# Patient Record
Sex: Female | Born: 1995 | Hispanic: Yes | Marital: Married | Smoking: Former smoker
Health system: Southern US, Community
[De-identification: ages and names within clinical notes are randomized; demographics above are authoritative.]

## PROBLEM LIST (undated history)

## (undated) DIAGNOSIS — I10 Essential (primary) hypertension: Secondary | ICD-10-CM

## (undated) DIAGNOSIS — Z8619 Personal history of other infectious and parasitic diseases: Secondary | ICD-10-CM

## (undated) DIAGNOSIS — E119 Type 2 diabetes mellitus without complications: Secondary | ICD-10-CM

## (undated) DIAGNOSIS — O24419 Gestational diabetes mellitus in pregnancy, unspecified control: Secondary | ICD-10-CM

## (undated) HISTORY — PX: NO PAST SURGERIES: SHX2092

## (undated) HISTORY — DX: Type 2 diabetes mellitus without complications: E11.9

## (undated) HISTORY — DX: Gestational diabetes mellitus in pregnancy, unspecified control: O24.419

---
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
## 2017-03-15 ENCOUNTER — Encounter (HOSPITAL_COMMUNITY): Payer: Self-pay | Admitting: Emergency Medicine

## 2017-03-15 ENCOUNTER — Ambulatory Visit (HOSPITAL_COMMUNITY)
Admission: EM | Admit: 2017-03-15 | Discharge: 2017-03-15 | Disposition: A | Discharge status: Home / Self Care | Payer: Self-pay | Attending: Family Medicine | Admitting: Family Medicine

## 2017-03-15 DIAGNOSIS — B349 Viral infection, unspecified: Secondary | ICD-10-CM

## 2017-03-15 DIAGNOSIS — M545 Low back pain: Secondary | ICD-10-CM

## 2017-03-15 DIAGNOSIS — G8929 Other chronic pain: Secondary | ICD-10-CM

## 2017-03-15 MED ORDER — BENZONATATE 100 MG PO CAPS: 100.0000 mg | ORAL_CAPSULE | Freq: Three times a day (TID) | ORAL | 0 refills | Status: DC

## 2017-03-15 MED ORDER — ACETAMINOPHEN 325 MG PO TABS
650.0000 mg | ORAL_TABLET | Freq: Once | ORAL | Status: AC
Start: 2017-03-15 — End: 2017-03-15
  Administered 2017-03-15: 650 mg via ORAL

## 2017-03-15 MED ORDER — ACETAMINOPHEN 325 MG PO TABS
ORAL_TABLET | ORAL | Status: AC
Start: 2017-03-15 — End: ?
  Filled 2017-03-15: qty 2

## 2017-03-15 MED ORDER — MELOXICAM 7.5 MG PO TABS: 7.5000 mg | ORAL_TABLET | Freq: Every day | ORAL | 0 refills | Status: DC

## 2017-03-15 MED ORDER — FLUTICASONE PROPIONATE 50 MCG/ACT NA SUSP: 2.0000 | Freq: Every day | NASAL | 0 refills | Status: DC

## 2017-03-15 MED ORDER — IPRATROPIUM BROMIDE 0.06 % NA SOLN: 2.0000 | Freq: Four times a day (QID) | NASAL | 0 refills | Status: DC

## 2017-03-15 NOTE — ED Provider Notes (Signed)
[facility]    CSN: [id number] Arrival date & time: 03/15/17  1534     History   Chief Complaint Chief Complaint  Patient presents with  . URI    HPI Julie Ellison is a 22 y.o. female.   22 year old female comes in for 2 day history of URI symptoms. Sneezing, nonproductive cough, rhinorrhea, nasal congestion, body aches, chills. Tactile fever this morning. Has not tried anything for the symptoms. Denies weakness, dizziness, chest pain, shortness of breath, palpitations. Positive sick contact. THC, last used 2 days ago, used to be every day, drop down to every few days, 1 year. Current every day smoker, 1 cigs/day, 1 year.   Patient states she started using THC due to chronic back pain. Denies injury in the past. States pain causes her to be unable to sleep, so she uses THC for it. States pain is bilateral without radiation. Has not tried anything else for it. Denies numbness/tingling, loss of bladder or bowel control       History reviewed. No pertinent past medical history.  There are no active problems to display for this patient.   History reviewed. No pertinent surgical history.  OB History    No data available       Home Medications    Prior to Admission medications   Medication Sig Start Date End Date Taking? Authorizing Provider  benzonatate (TESSALON) 100 MG capsule Take 1 capsule (100 mg total) by mouth every 8 (eight) hours. 03/15/17   Cathie HoopsYu, Charlisa Cham V, PA-C  fluticasone (FLONASE) 50 MCG/ACT nasal spray Place 2 sprays into both nostrils daily. 03/15/17   Cathie HoopsYu, Dru Laurel V, PA-C  ipratropium (ATROVENT) 0.06 % nasal spray Place 2 sprays into both nostrils 4 (four) times daily. 03/15/17   Cathie HoopsYu, Ardice Boyan V, PA-C  meloxicam (MOBIC) 7.5 MG tablet Take 1 tablet (7.5 mg total) by mouth daily. 03/15/17   Belinda FisherYu, Dao Mearns V, PA-C    Family History No family history on file.  Social History Social History   Tobacco Use  . Smoking status: Not on file  Substance Use Topics    . Alcohol use: Not on file  . Drug use: Not on file     Allergies   Patient has no known allergies.   Review of Systems Review of Systems  Reason unable to perform ROS: See HPI as above.     Physical Exam Triage Vital Signs ED Triage Vitals [03/15/17 1604]  Enc Vitals Group     BP (!) 144/84     Pulse Rate (!) 138     Resp 18     Temp (!) 100.4 F (38 C)     Temp Source Temporal     SpO2 100 %     Weight 150 lb (68 kg)     Height 5\' 3"  (1.6 m)     Head Circumference      Peak Flow      Pain Score 7     Pain Loc      Pain Edu?      Excl. in GC?    No data found.  Updated Vital Signs BP (!) 144/84   Pulse (!) 138   Temp 99.5 F (37.5 C)   Resp 18   Ht 5\' 3"  (1.6 m)   Wt 150 lb (68 kg)   LMP 03/10/2017   SpO2 97%   BMI 26.57 kg/m   Physical Exam  Constitutional: She is oriented to person, place, and time. She  appears well-developed and well-nourished. No distress.  HENT:  Head: Normocephalic and atraumatic.  Right Ear: Tympanic membrane, external ear and ear canal normal. Tympanic membrane is not erythematous and not bulging.  Left Ear: Tympanic membrane, external ear and ear canal normal. Tympanic membrane is not erythematous and not bulging.  Nose: Mucosal edema and rhinorrhea present. Right sinus exhibits no maxillary sinus tenderness and no frontal sinus tenderness. Left sinus exhibits no maxillary sinus tenderness and no frontal sinus tenderness.  Mouth/Throat: Uvula is midline, oropharynx is clear and moist and mucous membranes are normal.  Eyes: Conjunctivae are normal. Pupils are equal, round, and reactive to light.  Neck: Normal range of motion. Neck supple.  Cardiovascular: Regular rhythm and normal heart sounds. Tachycardia present. Exam reveals no gallop and no friction rub.  No murmur heard. Pulmonary/Chest: Effort normal and breath sounds normal. She has no decreased breath sounds. She has no wheezes. She has no rhonchi. She has no rales.   Musculoskeletal:  No tenderness on palpation of the spinous processes.  Tenderness on palpation of bilateral lumbar region.  Full range of motion.  Lymphadenopathy:    She has no cervical adenopathy.  Neurological: She is alert and oriented to person, place, and time.  Skin: Skin is warm and dry.  Psychiatric: She has a normal mood and affect. Her behavior is normal. Judgment normal.   UC Treatments / Results  Labs (all labs ordered are listed, but only abnormal results are displayed) Labs Reviewed - No data to display  EKG  EKG Interpretation None       Radiology No results found.  Procedures Procedures (including critical care time)  Medications Ordered in UC Medications  acetaminophen (TYLENOL) tablet 650 mg (650 mg Oral Given 03/15/17 1609)     Initial Impression / Assessment and Plan / UC Course  I have reviewed the triage vital signs and the nursing notes.  Pertinent labs & imaging results that were available during my care of the patient were reviewed by me and considered in my medical decision making (see chart for details).    Discussed with patient history and exam most consistent with viral URI. Patient with tachycardia on exam without chest pain, shortness of breath, palpitations, weakness, dizziness. Will have patient push fluids and monitor for now. Symptomatic treatment as needed. Return precautions given.   Will have patient try Mobic for chronic bilateral low back pain without sciatica, which is atraumatic. Return precautions given.   Patient expresses understanding and agrees to plan.   Final Clinical Impressions(s) / UC Diagnoses   Final diagnoses:  Viral syndrome  Chronic bilateral low back pain without sciatica    ED Discharge Orders        Ordered    benzonatate (TESSALON) 100 MG capsule  Every 8 hours     03/15/17 1637    fluticasone (FLONASE) 50 MCG/ACT nasal spray  Daily     03/15/17 1637    ipratropium (ATROVENT) 0.06 % nasal spray   4 times daily     03/15/17 1637    meloxicam (MOBIC) 7.5 MG tablet  Daily     03/15/17 1637        Belinda Fisher, PA-C 03/15/17 1646

## 2017-03-15 NOTE — ED Triage Notes (Signed)
PT reports she was sick with URI symptoms 2 weeks ago. PT reports symptoms returned 2 days ago and are worse now. PT reports sneezing, coughing, body aches, and chills.

## 2017-03-15 NOTE — Discharge Instructions (Signed)
Tessalon for cough. Start flonase, atrovent nasal spray for nasal congestion/drainage. You can use over the counter nasal saline rinse such as neti pot for nasal congestion. Keep hydrated, your urine should be clear to pale yellow in color. Tylenol/motrin for fever and pain. Monitor for any worsening of symptoms, chest pain, shortness of breath, wheezing, swelling of the throat, follow up for reevaluation.   Start Mobic as directed. Ice/heat compresses as needed. This can take up to 3-4 weeks to completely resolve, but you should be feeling better each week. Follow up here or with PCP if symptoms worsen, changes for reevaluation. If experience numbness/tingling of the inner thighs, loss of bladder or bowel control, go to the emergency department for evaluation.   If taking over the counter medicine, you can take:  Ibuprofen 800mg  three times a day Naproxen 440mg  twice a day

## 2017-11-03 ENCOUNTER — Inpatient Hospital Stay (HOSPITAL_COMMUNITY): Payer: Self-pay

## 2017-11-03 ENCOUNTER — Encounter (HOSPITAL_COMMUNITY)
Admission: AD | Disposition: A | Discharge status: Home / Self Care | Payer: Self-pay | Source: Ambulatory Visit | Attending: Family Medicine

## 2017-11-03 ENCOUNTER — Inpatient Hospital Stay (HOSPITAL_COMMUNITY): Payer: Self-pay | Admitting: Anesthesiology

## 2017-11-03 ENCOUNTER — Inpatient Hospital Stay (HOSPITAL_COMMUNITY)
Admission: AD | Admit: 2017-11-03 | Discharge: 2017-11-03 | Disposition: A | Discharge status: Home / Self Care | Payer: Self-pay | Source: Ambulatory Visit | Attending: Family Medicine | Admitting: Family Medicine

## 2017-11-03 ENCOUNTER — Encounter (HOSPITAL_COMMUNITY): Payer: Self-pay | Admitting: *Deleted

## 2017-11-03 DIAGNOSIS — Z7951 Long term (current) use of inhaled steroids: Secondary | ICD-10-CM | POA: Insufficient documentation

## 2017-11-03 DIAGNOSIS — O209 Hemorrhage in early pregnancy, unspecified: Secondary | ICD-10-CM

## 2017-11-03 DIAGNOSIS — Z3A01 Less than 8 weeks gestation of pregnancy: Secondary | ICD-10-CM | POA: Insufficient documentation

## 2017-11-03 DIAGNOSIS — Z87891 Personal history of nicotine dependence: Secondary | ICD-10-CM | POA: Insufficient documentation

## 2017-11-03 DIAGNOSIS — Z79899 Other long term (current) drug therapy: Secondary | ICD-10-CM | POA: Insufficient documentation

## 2017-11-03 DIAGNOSIS — Z791 Long term (current) use of non-steroidal anti-inflammatories (NSAID): Secondary | ICD-10-CM | POA: Insufficient documentation

## 2017-11-03 DIAGNOSIS — O00101 Right tubal pregnancy without intrauterine pregnancy: Secondary | ICD-10-CM | POA: Diagnosis present

## 2017-11-03 HISTORY — DX: Personal history of other infectious and parasitic diseases: Z86.19

## 2017-11-03 HISTORY — PX: DIAGNOSTIC LAPAROSCOPY WITH REMOVAL OF ECTOPIC PREGNANCY: SHX6449

## 2017-11-03 LAB — URINALYSIS, ROUTINE W REFLEX MICROSCOPIC
BILIRUBIN URINE: NEGATIVE
Bacteria, UA: NONE SEEN
Glucose, UA: NEGATIVE mg/dL
HGB URINE DIPSTICK: NEGATIVE
Ketones, ur: NEGATIVE mg/dL
NITRITE: NEGATIVE
PH: 8 (ref 5.0–8.0)
Protein, ur: NEGATIVE mg/dL
SPECIFIC GRAVITY, URINE: 1.019 (ref 1.005–1.030)

## 2017-11-03 LAB — CBC
HCT: 42.6 % (ref 36.0–46.0)
HEMOGLOBIN: 14.5 g/dL (ref 12.0–15.0)
MCH: 30.1 pg (ref 26.0–34.0)
MCHC: 34 g/dL (ref 30.0–36.0)
MCV: 88.6 fL (ref 78.0–100.0)
Platelets: 263 10*3/uL (ref 150–400)
RBC: 4.81 MIL/uL (ref 3.87–5.11)
RDW: 11.8 % (ref 11.5–15.5)
WBC: 11.2 10*3/uL — ABNORMAL HIGH (ref 4.0–10.5)

## 2017-11-03 LAB — COMPREHENSIVE METABOLIC PANEL
ALBUMIN: 4 g/dL (ref 3.5–5.0)
ALT: 28 U/L (ref 0–44)
ANION GAP: 5 (ref 5–15)
AST: 27 U/L (ref 15–41)
Alkaline Phosphatase: 63 U/L (ref 38–126)
BILIRUBIN TOTAL: 0.6 mg/dL (ref 0.3–1.2)
BUN: 10 mg/dL (ref 6–20)
CHLORIDE: 106 mmol/L (ref 98–111)
CO2: 26 mmol/L (ref 22–32)
Calcium: 9.2 mg/dL (ref 8.9–10.3)
Creatinine, Ser: 0.54 mg/dL (ref 0.44–1.00)
GFR calc Af Amer: >60 mL/min (ref >60)
Glucose, Bld: 95 mg/dL (ref 70–99)
POTASSIUM: 3.7 mmol/L (ref 3.5–5.1)
Sodium: 137 mmol/L (ref 135–145)
TOTAL PROTEIN: 7.4 g/dL (ref 6.5–8.1)

## 2017-11-03 LAB — WET PREP, GENITAL
SPERM: NONE SEEN
Trich, Wet Prep: NONE SEEN
YEAST WET PREP: NONE SEEN

## 2017-11-03 LAB — POCT PREGNANCY, URINE: Preg Test, Ur: POSITIVE — AB

## 2017-11-03 LAB — TYPE AND SCREEN
ABO/RH(D): O POS
Antibody Screen: NEGATIVE

## 2017-11-03 LAB — ABO/RH: ABO/RH(D): O POS

## 2017-11-03 LAB — HCG, QUANTITATIVE, PREGNANCY: HCG, BETA CHAIN, QUANT, S: 16930 m[IU]/mL — AB (ref <5)

## 2017-11-03 SURGERY — LAPAROSCOPY, WITH ECTOPIC PREGNANCY SURGICAL TREATMENT
Anesthesia: General | Site: Abdomen

## 2017-11-03 MED ORDER — HYDROMORPHONE HCL 1 MG/ML IJ SOLN
INTRAMUSCULAR | Status: DC | PRN
  Administered 2017-11-03: 1 mg via INTRAVENOUS

## 2017-11-03 MED ORDER — IBUPROFEN 600 MG PO TABS: 600.0000 mg | ORAL_TABLET | Freq: Four times a day (QID) | ORAL | 1 refills | Status: DC | PRN

## 2017-11-03 MED ORDER — OXYCODONE-ACETAMINOPHEN 5-325 MG PO TABS: 1.0000 | ORAL_TABLET | Freq: Four times a day (QID) | ORAL | 0 refills | Status: DC | PRN

## 2017-11-03 MED ORDER — ONDANSETRON HCL 4 MG/2ML IJ SOLN
INTRAMUSCULAR | Status: DC | PRN
  Administered 2017-11-03: 4 mg via INTRAVENOUS

## 2017-11-03 MED ORDER — PROPOFOL 10 MG/ML IV BOLUS
INTRAVENOUS | Status: AC
  Filled 2017-11-03: qty 20

## 2017-11-03 MED ORDER — MIDAZOLAM HCL 2 MG/2ML IJ SOLN
INTRAMUSCULAR | Status: DC | PRN
  Administered 2017-11-03: 2 mg via INTRAVENOUS

## 2017-11-03 MED ORDER — GLYCOPYRROLATE 0.2 MG/ML IJ SOLN
INTRAMUSCULAR | Status: DC | PRN
  Administered 2017-11-03: 0.1 mg via INTRAVENOUS

## 2017-11-03 MED ORDER — FAMOTIDINE IN NACL 20-0.9 MG/50ML-% IV SOLN
20.0000 mg | Freq: Once | INTRAVENOUS | Status: AC
  Administered 2017-11-03: 20 mg via INTRAVENOUS
  Filled 2017-11-03: qty 50

## 2017-11-03 MED ORDER — KETOROLAC TROMETHAMINE 30 MG/ML IJ SOLN
INTRAMUSCULAR | Status: DC | PRN
  Administered 2017-11-03: 30 mg via INTRAVENOUS

## 2017-11-03 MED ORDER — GLYCOPYRROLATE 0.2 MG/ML IJ SOLN
INTRAMUSCULAR | Status: AC
Start: 2017-11-03 — End: 2017-11-03
  Filled 2017-11-03: qty 1

## 2017-11-03 MED ORDER — SOD CITRATE-CITRIC ACID 500-334 MG/5ML PO SOLN
30.0000 mL | Freq: Once | ORAL | Status: AC
  Administered 2017-11-03: 30 mL via ORAL
  Filled 2017-11-03: qty 15

## 2017-11-03 MED ORDER — LIDOCAINE HCL (CARDIAC) PF 100 MG/5ML IV SOSY
PREFILLED_SYRINGE | INTRAVENOUS | Status: AC
  Filled 2017-11-03: qty 5

## 2017-11-03 MED ORDER — BUPIVACAINE HCL (PF) 0.5 % IJ SOLN
INTRAMUSCULAR | Status: AC
  Filled 2017-11-03: qty 30

## 2017-11-03 MED ORDER — ONDANSETRON HCL 4 MG/2ML IJ SOLN
INTRAMUSCULAR | Status: AC
  Filled 2017-11-03: qty 2

## 2017-11-03 MED ORDER — SODIUM CHLORIDE 0.9 % IR SOLN
Status: DC | PRN
  Administered 2017-11-03: 3000 mL

## 2017-11-03 MED ORDER — FENTANYL CITRATE (PF) 100 MCG/2ML IJ SOLN
25.0000 ug | INTRAMUSCULAR | Status: DC | PRN
  Administered 2017-11-03 (×2): 50 ug via INTRAVENOUS

## 2017-11-03 MED ORDER — FENTANYL CITRATE (PF) 100 MCG/2ML IJ SOLN
INTRAMUSCULAR | Status: AC
  Filled 2017-11-03: qty 2

## 2017-11-03 MED ORDER — FENTANYL CITRATE (PF) 100 MCG/2ML IJ SOLN
INTRAMUSCULAR | Status: DC | PRN
  Administered 2017-11-03 (×2): 50 ug via INTRAVENOUS
  Administered 2017-11-03: 100 ug via INTRAVENOUS
  Administered 2017-11-03: 50 ug via INTRAVENOUS

## 2017-11-03 MED ORDER — LACTATED RINGERS IV SOLN
INTRAVENOUS | Status: DC
  Administered 2017-11-03 (×2): via INTRAVENOUS

## 2017-11-03 MED ORDER — SUGAMMADEX SODIUM 200 MG/2ML IV SOLN
INTRAVENOUS | Status: DC | PRN
  Administered 2017-11-03: 150 mg via INTRAVENOUS

## 2017-11-03 MED ORDER — VASOPRESSIN 20 UNIT/ML IV SOLN
INTRAVENOUS | Status: DC | PRN
  Administered 2017-11-03: 10 mL via INTRAMUSCULAR

## 2017-11-03 MED ORDER — PROPOFOL 10 MG/ML IV BOLUS
INTRAVENOUS | Status: DC | PRN
  Administered 2017-11-03: 150 mg via INTRAVENOUS

## 2017-11-03 MED ORDER — MIDAZOLAM HCL 2 MG/2ML IJ SOLN
INTRAMUSCULAR | Status: AC
  Filled 2017-11-03: qty 2

## 2017-11-03 MED ORDER — ROCURONIUM BROMIDE 100 MG/10ML IV SOLN
INTRAVENOUS | Status: AC
  Filled 2017-11-03: qty 1

## 2017-11-03 MED ORDER — BUPIVACAINE HCL (PF) 0.5 % IJ SOLN
INTRAMUSCULAR | Status: DC | PRN
  Administered 2017-11-03: 30 mL

## 2017-11-03 MED ORDER — SUGAMMADEX SODIUM 200 MG/2ML IV SOLN
INTRAVENOUS | Status: AC
  Filled 2017-11-03: qty 2

## 2017-11-03 MED ORDER — DEXAMETHASONE SODIUM PHOSPHATE 10 MG/ML IJ SOLN
INTRAMUSCULAR | Status: DC | PRN
  Administered 2017-11-03: 5 mg via INTRAVENOUS

## 2017-11-03 MED ORDER — SODIUM CHLORIDE 0.9 % IJ SOLN
INTRAMUSCULAR | Status: AC
  Filled 2017-11-03: qty 10

## 2017-11-03 MED ORDER — BUPIVACAINE HCL (PF) 0.25 % IJ SOLN
INTRAMUSCULAR | Status: AC
  Filled 2017-11-03: qty 30

## 2017-11-03 MED ORDER — SUCCINYLCHOLINE CHLORIDE 200 MG/10ML IV SOSY
PREFILLED_SYRINGE | INTRAVENOUS | Status: AC
  Filled 2017-11-03: qty 10

## 2017-11-03 MED ORDER — DEXAMETHASONE SODIUM PHOSPHATE 10 MG/ML IJ SOLN
INTRAMUSCULAR | Status: AC
  Filled 2017-11-03: qty 1

## 2017-11-03 MED ORDER — ROCURONIUM BROMIDE 100 MG/10ML IV SOLN
INTRAVENOUS | Status: DC | PRN
  Administered 2017-11-03: 10 mg via INTRAVENOUS
  Administered 2017-11-03: 50 mg via INTRAVENOUS

## 2017-11-03 MED ORDER — ACETAMINOPHEN 10 MG/ML IV SOLN: 1000.0000 mg | Freq: Once | INTRAVENOUS | Status: DC | PRN

## 2017-11-03 MED ORDER — FENTANYL CITRATE (PF) 250 MCG/5ML IJ SOLN
INTRAMUSCULAR | Status: AC
  Filled 2017-11-03: qty 5

## 2017-11-03 MED ORDER — LIDOCAINE HCL (CARDIAC) PF 100 MG/5ML IV SOSY
PREFILLED_SYRINGE | INTRAVENOUS | Status: DC | PRN
  Administered 2017-11-03: 100 mg via INTRAVENOUS

## 2017-11-03 MED ORDER — HYDROMORPHONE HCL 1 MG/ML IJ SOLN
INTRAMUSCULAR | Status: AC
  Filled 2017-11-03: qty 1

## 2017-11-03 SURGICAL SUPPLY — 27 items
BLADE SURG 15 STRL LF C SS BP (BLADE) ×1 IMPLANT
BLADE SURG 15 STRL SS (BLADE) ×2
DRSG OPSITE POSTOP 3X4 (GAUZE/BANDAGES/DRESSINGS) IMPLANT
DURAPREP 26ML APPLICATOR (WOUND CARE) ×3 IMPLANT
GLOVE BIOGEL PI IND STRL 7.0 (GLOVE) ×4 IMPLANT
GLOVE BIOGEL PI INDICATOR 7.0 (GLOVE) ×8
GLOVE ECLIPSE 7.0 STRL STRAW (GLOVE) ×3 IMPLANT
GOWN STRL REUS W/TWL LRG LVL3 (GOWN DISPOSABLE) ×9 IMPLANT
HEMOSTAT ARISTA ABSORB 3G PWDR (MISCELLANEOUS) ×3 IMPLANT
PACK LAPAROSCOPY BASIN (CUSTOM PROCEDURE TRAY) ×3 IMPLANT
PACK TRENDGUARD 450 HYBRID PRO (MISCELLANEOUS) ×1 IMPLANT
POUCH SPECIMEN RETRIEVAL 10MM (ENDOMECHANICALS) ×6 IMPLANT
PROTECTOR NERVE ULNAR (MISCELLANEOUS) ×6 IMPLANT
SET IRRIG TUBING LAPAROSCOPIC (IRRIGATION / IRRIGATOR) ×3 IMPLANT
SHEARS HARMONIC ACE PLUS 36CM (ENDOMECHANICALS) ×3 IMPLANT
SLEEVE XCEL OPT CAN 5 100 (ENDOMECHANICALS) ×3 IMPLANT
SUT VIC AB 3-0 X1 27 (SUTURE) ×3 IMPLANT
SUT VICRYL 0 UR6 27IN ABS (SUTURE) ×6 IMPLANT
TOWEL OR 17X24 6PK STRL BLUE (TOWEL DISPOSABLE) ×6 IMPLANT
TRAY FOLEY W/BAG SLVR 14FR (SET/KITS/TRAYS/PACK) ×6 IMPLANT
TRENDGUARD 450 HYBRID PRO PACK (MISCELLANEOUS) ×3
TROCAR BALLN 12MMX100 BLUNT (TROCAR) ×3 IMPLANT
TROCAR OPTI TIP 5M 100M (ENDOMECHANICALS) ×3 IMPLANT
TROCAR XCEL NON-BLD 11X100MML (ENDOMECHANICALS) IMPLANT
TROCAR XCEL NON-BLD 5MMX100MML (ENDOMECHANICALS) ×3 IMPLANT
TUBING INSUF HEATED (TUBING) ×3 IMPLANT
WARMER LAPAROSCOPE (MISCELLANEOUS) ×3 IMPLANT

## 2017-11-03 NOTE — Brief Op Note (Signed)
11/03/2017  8:49 PM  PATIENT:  Julie Ellison  22 y.o. female  PRE-OPERATIVE DIAGNOSIS:  Ectopic pregnancy  POST-OPERATIVE DIAGNOSIS:  Ectopic pregnancy  PROCEDURE:  Procedure(s): DIAGNOSTIC LAPAROSCOPY, Right Salpingectomy with removal ECTOPIC PREGNANCY (N/A)  SURGEON:  Surgeon(s) and Role:    * Willodean Rosenthal, MD - Primary  ANESTHESIA:   general  EBL:  10 mL   BLOOD ADMINISTERED:none  DRAINS: none   LOCAL MEDICATIONS USED:  MARCAINE; Dilute vasopressin solution     SPECIMEN:  Source of Specimen:  right fallopian tube with ectopic  DISPOSITION OF SPECIMEN:  PATHOLOGY  COUNTS:  YES  TOURNIQUET:  * No tourniquets in log *  DICTATION: .Note written in EPIC  PLAN OF CARE: Discharge to home after PACU  PATIENT DISPOSITION:  PACU - hemodynamically stable.   Delay start of Pharmacological VTE agent (>24hrs) due to surgical blood loss or risk of bleeding: n/a  Complications: none immediate.   Caylon Saine L. Harraway-Smith, M.D., [facility] Core

## 2017-11-03 NOTE — MAU Provider Note (Signed)
Chief Complaint: Vaginal Bleeding and Possible Pregnancy   First Provider Initiated Contact with Patient 11/03/17 1431     SUBJECTIVE HPI: Julie Ellison is a 22 y.o. G1P0 at [redacted]w[redacted]d who presents to Maternity Admissions reporting vaginal bleeding. Symptoms started today. Had bright red spotting on toilet paper. Not bleeding into pad or passing clots. Denies abdominal pain. Had low back pain earlier that has since resolved. Denies dysuria or vaginal discharge. Had intercourse last night. Has not started prenatal care.    Past Medical History:  Diagnosis Date  . Hx of gonorrhea 2017   OB History  Gravida Para Term Preterm AB Living  1            SAB TAB Ectopic Multiple Live Births               # Outcome Date GA Lbr Len/2nd Weight Sex Delivery Anes PTL Lv  1 Current            Past Surgical History:  Procedure Laterality Date  . NO PAST SURGERIES     Social History   Socioeconomic History  . Marital status: Married    Spouse name: Not on file  . Number of children: Not on file  . Years of education: Not on file  . Highest education level: Not on file  Occupational History  . Not on file  Social Needs  . Financial resource strain: Not on file  . Food insecurity:    Worry: Not on file    Inability: Not on file  . Transportation needs:    Medical: Not on file    Non-medical: Not on file  Tobacco Use  . Smoking status: Former Games developer  . Smokeless tobacco: Never Used  Substance and Sexual Activity  . Alcohol use: Not Currently  . Drug use: Never  . Sexual activity: Yes    Birth control/protection: None    Comment: IUD removed 09/05/17  Lifestyle  . Physical activity:    Days per week: Not on file    Minutes per session: Not on file  . Stress: Not on file  Relationships  . Social connections:    Talks on phone: Not on file    Gets together: Not on file    Attends religious service: Not on file    Active member of club or organization: Not on file     Attends meetings of clubs or organizations: Not on file    Relationship status: Not on file  . Intimate partner violence:    Fear of current or ex partner: Not on file    Emotionally abused: Not on file    Physically abused: Not on file    Forced sexual activity: Not on file  Other Topics Concern  . Not on file  Social History Narrative  . Not on file   History reviewed. No pertinent family history. No current facility-administered medications on file prior to encounter.    Current Outpatient Medications on File Prior to Encounter  Medication Sig Dispense Refill  . benzonatate (TESSALON) 100 MG capsule Take 1 capsule (100 mg total) by mouth every 8 (eight) hours. 21 capsule 0  . fluticasone (FLONASE) 50 MCG/ACT nasal spray Place 2 sprays into both nostrils daily. 1 g 0  . ipratropium (ATROVENT) 0.06 % nasal spray Place 2 sprays into both nostrils 4 (four) times daily. 15 mL 0  . meloxicam (MOBIC) 7.5 MG tablet Take 1 tablet (7.5 mg total) by mouth daily. 15 tablet 0  No Known Allergies  I have reviewed patient's Past Medical Hx, Surgical Hx, Family Hx, Social Hx, medications and allergies.   Review of Systems  Constitutional: Negative.   Gastrointestinal: Negative.   Genitourinary: Positive for vaginal bleeding. Negative for dysuria and vaginal discharge.    OBJECTIVE Patient Vitals for the past 24 hrs:  BP Temp Temp src Pulse Resp SpO2 Height Weight  11/03/17 1358 109/69 98.1 F (36.7 C) Oral 82 15 100 % 5\' 3"  (1.6 m) 74.4 kg   Constitutional: Well-developed, well-nourished female in no acute distress.  Cardiovascular: normal rate & rhythm, no murmur Respiratory: normal rate and effort. Lung sounds clear throughout GI: Abd soft, non-tender, Pos BS x 4. No guarding or rebound tenderness MS: Extremities nontender, no edema, normal ROM Neurologic: Alert and oriented x 4.  GU:     SPECULUM EXAM: NEFG, physiologic discharge, small smear of pink mucoid blood at os, no active  bleeding, cervix clean  BIMANUAL: No CMT. cervix closed; uterus normal size, no adnexal tenderness or masses.    LAB RESULTS Results for orders placed or performed during the hospital encounter of 11/03/17 (from the past 24 hour(s))  Urinalysis, Routine w reflex microscopic     Status: Abnormal   Collection Time: 11/03/17  2:06 PM  Result Value Ref Range   Color, Urine YELLOW YELLOW   APPearance HAZY (A) CLEAR   Specific Gravity, Urine 1.019 1.005 - 1.030   pH 8.0 5.0 - 8.0   Glucose, UA NEGATIVE NEGATIVE mg/dL   Hgb urine dipstick NEGATIVE NEGATIVE   Bilirubin Urine NEGATIVE NEGATIVE   Ketones, ur NEGATIVE NEGATIVE mg/dL   Protein, ur NEGATIVE NEGATIVE mg/dL   Nitrite NEGATIVE NEGATIVE   Leukocytes, UA TRACE (A) NEGATIVE   RBC / HPF 0-5 0 - 5 RBC/hpf   WBC, UA 0-5 0 - 5 WBC/hpf   Bacteria, UA NONE SEEN NONE SEEN   Squamous Epithelial / LPF 0-5 0 - 5   Mucus PRESENT   Pregnancy, urine POC     Status: Abnormal   Collection Time: 11/03/17  2:11 PM  Result Value Ref Range   Preg Test, Ur POSITIVE (A) NEGATIVE  CBC     Status: Abnormal   Collection Time: 11/03/17  3:03 PM  Result Value Ref Range   WBC 11.2 (H) 4.0 - 10.5 K/uL   RBC 4.81 3.87 - 5.11 MIL/uL   Hemoglobin 14.5 12.0 - 15.0 g/dL   HCT 54.0 98.1 - 19.1 %   MCV 88.6 78.0 - 100.0 fL   MCH 30.1 26.0 - 34.0 pg   MCHC 34.0 30.0 - 36.0 g/dL   RDW 47.8 29.5 - 62.1 %   Platelets 263 150 - 400 K/uL  ABO/Rh     Status: None   Collection Time: 11/03/17  3:03 PM  Result Value Ref Range   ABO/RH(D)      O POS Performed at [facility], [address], [place], [place] [address]   hCG, quantitative, pregnancy     Status: Abnormal   Collection Time: 11/03/17  3:03 PM  Result Value Ref Range   hCG, Beta Chain, Quant, S 16,930 (H) <5 mIU/mL  Comprehensive metabolic panel     Status: None   Collection Time: 11/03/17  3:03 PM  Result Value Ref Range   Sodium 137 135 - 145 mmol/L   Potassium 3.7 3.5 - 5.1 mmol/L    Chloride 106 98 - 111 mmol/L   CO2 26 22 - 32 mmol/L  Glucose, Bld 95 70 - 99 mg/dL   BUN 10 6 - 20 mg/dL   Creatinine, Ser 1.61 0.44 - 1.00 mg/dL   Calcium 9.2 8.9 - 09.6 mg/dL   Total Protein 7.4 6.5 - 8.1 g/dL   Albumin 4.0 3.5 - 5.0 g/dL   AST 27 15 - 41 U/L   ALT 28 0 - 44 U/L   Alkaline Phosphatase 63 38 - 126 U/L   Total Bilirubin 0.6 0.3 - 1.2 mg/dL   GFR calc non Af Amer >60 >60 mL/min   GFR calc Af Amer >60 >60 mL/min   Anion gap 5 5 - 15  Wet prep, genital     Status: Abnormal   Collection Time: 11/03/17  3:28 PM  Result Value Ref Range   Yeast Wet Prep HPF POC NONE SEEN NONE SEEN   Trich, Wet Prep NONE SEEN NONE SEEN   Clue Cells Wet Prep HPF POC PRESENT (A) NONE SEEN   WBC, Wet Prep HPF POC MANY (A) NONE SEEN   Sperm NONE SEEN     IMAGING US Ob Less Than 14 Weeks With Ob Transvaginal  Result Date: 11/03/2017 CLINICAL DATA:  Vaginal spotting EXAM: OBSTETRIC <14 WK [place] AND TRANSVAGINAL OB US TECHNIQUE: Both transabdominal and transvaginal ultrasound examinations were performed for complete evaluation of the gestation as well as the maternal uterus, adnexal regions, and pelvic cul-de-sac. Transvaginal technique was performed to assess early pregnancy. COMPARISON:  None. FINDINGS: Intrauterine gestational sac: Not visualized Yolk sac:  Visualized in ectopic location Embryo:  Visualized in ectopic location Cardiac Activity: Not visualized CRL:  8 mm   6 w   4 d Subchorionic hemorrhage:  None visualized. Maternal uterus/adnexae: There is a fetal pole and yolk sac within the gestational sac located in the right fallopian tube adjacent to but separate from the uterus. No fetal heart activity is evident. There is no intrauterine mass or gestation. The left ovary appears normal. There is a small amount of free pelvic fluid. IMPRESSION: Ectopic gestation in the right fallopian tube adjacent to the uterus. No intrauterine gestation seen. No fetal heart activity identified within the  ectopic gestation. Small amount of free fluid noted. Critical Value/emergent results were called by telephone at the time of interpretation on 11/03/2017 at 4:07 pm to Judeth Horn, NP, who verbally acknowledged these results. Electronically Signed   By: Bretta Bang III M.D.   On: 11/03/2017 16:07    MAU COURSE Orders Placed This Encounter  Procedures  . Wet prep, genital  . US OB LESS THAN 14 WEEKS WITH OB TRANSVAGINAL  . Urinalysis, Routine w reflex microscopic  . CBC  . hCG, quantitative, pregnancy  . HIV Antibody (routine testing w rflx)  . Comprehensive metabolic panel  . Pregnancy, urine POC  . ABO/Rh   No orders of the defined types were placed in this encounter.   MDM +UPT UA, wet prep, GC/chlamydia, CBC, ABO/Rh, quant hCG, and [place] today to rule out ectopic pregnancy Ultrasound shows ectopic pregnancy in right adnexa. Small free fluid. Gestational sac with YS & fetal pole, no cardiac activity. HCG 16,930 Reviewed with Dr. Shawnie Pons. Recommends surgery d/t presence of YS and HCG >5k.  Discussed results with patient & recommendation for surgery. Patient agreeable with plan. Hasn't eaten today.   ASSESSMENT 1. Right tubal pregnancy without intrauterine pregnancy   2. Vaginal bleeding in pregnancy, first trimester     PLAN Dr. Shawnie Pons to come speak with patient regarding plan for surgery  Judeth Horn, NP 11/03/2017  4:34 PM

## 2017-11-03 NOTE — H&P (Signed)
Julie Ellison is an 22 y.o. G1P0 female.   Chief Complaint:  spotting HPI: Here for spotting. Pregnant. W/u reveals right ectopic fetal pole with HCG of 78[address]. Counseled about R/B/A of MTX vs. Laparoscopic removal.  Past Medical History:  Diagnosis Date  . Hx of gonorrhea 2017    Past Surgical History:  Procedure Laterality Date  . NO PAST SURGERIES      History reviewed. No pertinent family history. Social History:  reports that she has quit smoking. She has never used smokeless tobacco. She reports that she drank alcohol. She reports that she does not use drugs.  Allergies: No Known Allergies  Medications Prior to Admission  Medication Sig Dispense Refill  . benzonatate (TESSALON) 100 MG capsule Take 1 capsule (100 mg total) by mouth every 8 (eight) hours. 21 capsule 0  . fluticasone (FLONASE) 50 MCG/ACT nasal spray Place 2 sprays into both nostrils daily. 1 g 0  . ipratropium (ATROVENT) 0.06 % nasal spray Place 2 sprays into both nostrils 4 (four) times daily. 15 mL 0  . meloxicam (MOBIC) 7.5 MG tablet Take 1 tablet (7.5 mg total) by mouth daily. 15 tablet 0    A comprehensive review of systems was negative.  Blood pressure 109/69, pulse 82, temperature 98.1 F (36.7 C), temperature source Oral, resp. rate 15, height 5\' 3"  (1.6 m), weight 74.4 kg, last menstrual period 09/24/2017, SpO2 100 %. General appearance: alert, cooperative and appears stated age Head: Normocephalic, without obvious abnormality, atraumatic Neck: supple, symmetrical, trachea midline Lungs: normal effort Heart: regular rate and rhythm Abdomen: soft, non-tender; bowel sounds normal; no masses,  no organomegaly Extremities: extremities normal, atraumatic, no cyanosis or edema Skin: Skin color, texture, turgor normal. No rashes or lesions Neurologic: Grossly normal   Lab Results  Component Value Date   WBC 11.2 (H) 11/03/2017   HGB 14.5 11/03/2017   HCT 42.6 11/03/2017   MCV 88.6  11/03/2017   PLT 263 11/03/2017   Lab Results  Component Value Date   PREGTESTUR POSITIVE (A) 11/03/2017     Assessment/Plan Active Problems:   Right tubal pregnancy without intrauterine pregnancy  For laparoscopic removal with salpingectomy--Dr. Willodean Rosenthal will complete the surgery Risks include but are not limited to bleeding, infection, injury to surrounding structures, including bowel, bladder and ureters, blood clots, and death.  Likelihood of success is high.    Reva Bores 11/03/2017, 5:16 PM

## 2017-11-03 NOTE — Transfer of Care (Addendum)
Immediate Anesthesia Transfer of Care Note  Patient: Julie Ellison  Procedure(s) Performed: DIAGNOSTIC LAPAROSCOPY, Right Salpingectomy with removal ECTOPIC PREGNANCY (N/A Abdomen)  Patient Location: PACU  Anesthesia Type:General  Level of Consciousness: awake, alert  and oriented  Airway & Oxygen Therapy: Patient Spontanous Breathing and Patient connected to nasal cannula oxygen  Post-op Assessment: Report given to RN  Post vital signs: Reviewed and stable HR 95, 14, SaO2 100%, Bp 121/55  Last Vitals:  Vitals Value Taken Time  BP 121/55 11/03/2017  7:56 PM  Temp    Pulse 94 11/03/2017  7:58 PM  Resp 14 11/03/2017  7:58 PM  SpO2 99 % 11/03/2017  7:58 PM  Vitals shown include unvalidated device data.  Last Pain:  Vitals:   11/03/17 1358  TempSrc: Oral         Complications: No apparent anesthesia complications

## 2017-11-03 NOTE — Anesthesia Preprocedure Evaluation (Signed)
Anesthesia Evaluation  Patient identified by MRN, date of birth, ID band Patient awake    Reviewed: Allergy & Precautions, NPO status , Patient's Chart, lab work & pertinent test results  Airway Mallampati: I  TM Distance: >3 FB Neck ROM: Full    Dental no notable dental hx. (+) Teeth Intact, Dental Advisory Given   Pulmonary neg pulmonary ROS, former smoker,    Pulmonary exam normal breath sounds clear to auscultation       Cardiovascular negative cardio ROS Normal cardiovascular exam Rhythm:Regular Rate:Normal     Neuro/Psych negative neurological ROS  negative psych ROS   GI/Hepatic negative GI ROS, Neg liver ROS,   Endo/Other  negative endocrine ROS  Renal/GU negative Renal ROS  negative genitourinary   Musculoskeletal negative musculoskeletal ROS (+)   Abdominal   Peds  Hematology negative hematology ROS (+)   Anesthesia Other Findings Ectopic pregnancy  Reproductive/Obstetrics                             Anesthesia Physical Anesthesia Plan  ASA: I  Anesthesia Plan: General   Post-op Pain Management:    Induction: Intravenous  PONV Risk Score and Plan: 3 and Ondansetron, Dexamethasone and Midazolam  Airway Management Planned: Oral ETT  Additional Equipment:   Intra-op Plan:   Post-operative Plan: Extubation in OR  Informed Consent: I have reviewed the patients History and Physical, chart, labs and discussed the procedure including the risks, benefits and alternatives for the proposed anesthesia with the patient or authorized representative who has indicated his/her understanding and acceptance.   Dental advisory given  Plan Discussed with: CRNA  Anesthesia Plan Comments:         Anesthesia Quick Evaluation

## 2017-11-03 NOTE — Anesthesia Procedure Notes (Signed)
Procedure Name: Intubation Date/Time: 11/03/2017 6:24 PM Performed by: Flossie Dibble, CRNA Pre-anesthesia Checklist: Patient identified, Patient being monitored, Timeout performed, Emergency Drugs available and Suction available Patient Re-evaluated:Patient Re-evaluated prior to induction Oxygen Delivery Method: Circle System Utilized Preoxygenation: Pre-oxygenation with 100% oxygen Induction Type: IV induction Ventilation: Mask ventilation without difficulty Laryngoscope Size: Mac and 3 Grade View: Grade I Tube type: Oral Tube size: 7.0 mm Number of attempts: 1 Airway Equipment and Method: stylet Placement Confirmation: ETT inserted through vocal cords under direct vision,  positive ETCO2 and breath sounds checked- equal and bilateral Secured at: 21 cm Tube secured with: Tape Dental Injury: Teeth and Oropharynx as per pre-operative assessment

## 2017-11-03 NOTE — Op Note (Signed)
11/03/2017  8:49 PM  PATIENT:  Julie Ellison  22 y.o. female  PRE-OPERATIVE DIAGNOSIS:  Ectopic pregnancy  POST-OPERATIVE DIAGNOSIS:  Ectopic pregnancy  PROCEDURE:  Procedure(s): DIAGNOSTIC LAPAROSCOPY, Right Salpingectomy with removal ECTOPIC PREGNANCY (N/A)  SURGEON:  Surgeon(s) and Role:    * Willodean Rosenthal, MD - Primary  ANESTHESIA:   general  EBL:  10 mL   BLOOD ADMINISTERED:none  DRAINS: none   LOCAL MEDICATIONS USED:  MARCAINE; Dilute vasopressin solution     SPECIMEN:  Source of Specimen:  right fallopian tube with ectopic  DISPOSITION OF SPECIMEN:  PATHOLOGY  COUNTS:  YES  TOURNIQUET:  * No tourniquets in log *  DICTATION: .Note written in EPIC  PLAN OF CARE: Discharge to home after PACU  PATIENT DISPOSITION:  PACU - hemodynamically stable.   Delay start of Pharmacological VTE agent (>24hrs) due to surgical blood loss or risk of bleeding: n/a  Complications: none immediate.    INDICATIONS: 22 y.o. G1P0 at [redacted]w[redacted]d here with the preoperative diagnoses as listed above.  Please refer to preoperative notes for more details. Patient was counseled regarding need for laparoscopic salpingectomy. Risks of surgery including bleeding which may require transfusion or reoperation, infection, injury to bowel or other surrounding organs, need for additional procedures including laparotomy and other postoperative/anesthesia complications were explained to patient.  Pt was given the opportunity to try to preserve the fallopian tube vs salpingectomy. The pt and her husband elected for definitve treatment with removal of the fallopian tube. Written informed consent was obtained.  FINDINGS:  No hemoperitoneum was noted.  Dilated right fallopian tube very close to the cornua containing ectopic gestation. This was not ruptured. Small normal appearing uterus, normal left fallopian tube, right ovary and left ovary.  PROCEDURE IN DETAIL:  The patient was taken to the  operating room where general anesthesia was administered and was found to be adequate.  She was placed in the dorsal lithotomy position, and was prepped and draped in a sterile manner.  A Foley catheter was inserted into her bladder and attached to constant drainage and a uterine manipulator was then advanced into the uterus .    After an adequate timeout was performed, attention was turned to the abdomen where an umbilical incision was made with the scalpel.  A Veress needle was placed in the incision and placement was confirmed with instillation of normal saline. The abdomen was then insufflated with carbon dioxide gas and adequate pneumoperitoneum was obtained. A 5 mm trocar and sleeve were then advanced without difficulty into the abdomen.   A survey of the patient's pelvis and abdomen revealed the findings above.  A 5-mm right upper quadrant port and a 10mm right lower quadrant port were then placed under direct visualization.  10cc of a dilute vasopressin solution was injected into the cornua and fallopian tube on the right using a spinal needle through the abodmnal wall.  Attention was then turned to the right fallopian tube which was grasped and ligated from the underlying mesosalpinx and uterine attachment using the Harmonic instrument.  Good hemostasis was noted.  The specimen was placed in an EndoCatch bag and removed from the abdomen intact.  The abdomen was desufflated, and all instruments were removed.  The fascial incision of the 10-mm site was reapproximated with a 0 Vicryl figure-of-eight stitch; and all skin incisions were closed with 3-0 Vicryl and Dermabond. The patient tolerated the procedure well.  All instruments, needles, and sponge counts were correct x 2. The patient  was taken to the recovery room in stable condition.   The patient will be discharged to home as per PACU criteria.  Routine postoperative instructions given.  She was prescribed Percocet and Ibuprofen. She will follow up in  the clinic in about 2-3 weeks for postoperative evaluation.   Neysha Criado L. Erin Fulling, MD, FACOG Attending Obstetrician & Gynecologist Faculty Practice, [facility]

## 2017-11-03 NOTE — Discharge Instructions (Signed)
Ectopic Pregnancy An ectopic pregnancy is when the fertilized egg attaches (implants) outside the uterus. Most ectopic pregnancies occur in one of the tubes where eggs travel from the ovary to the uterus (fallopian tubes), but the implanting can occur in other locations. In rare cases, ectopic pregnancies occur on the ovary, intestine, pelvis, abdomen, or cervix. In an ectopic pregnancy, the fertilized egg does not have the ability to develop into a normal, healthy baby. A ruptured ectopic pregnancy is one in which tearing or bursting of a fallopian tube causes internal bleeding. Often, there is intense lower abdominal pain, and vaginal bleeding sometimes occurs. Having an ectopic pregnancy can be life-threatening. If this dangerous condition is not treated, it can lead to blood loss, shock, or even death. What are the causes? The most common cause of this condition is damage to one of the fallopian tubes. A fallopian tube may be narrowed or blocked, and that keeps the fertilized egg from reaching the uterus. What increases the risk? This condition is more likely to develop in women of childbearing age who have different levels of risk. The levels of risk can be divided into three categories. High risk  You have gone through infertility treatment.  You have had an ectopic pregnancy before.  You have had surgery on the fallopian tubes, or another surgical procedure, such as an abortion.  You have had surgery to have the fallopian tubes tied (tubal ligation).  You have problems or diseases of the fallopian tubes.  You have been exposed to diethylstilbestrol (DES). This medicine was used until 1971, and it had effects on babies whose mothers took the medicine.  You become pregnant while using an IUD (intrauterine device) for birth control. Moderate risk  You have a history of infertility.  You have had an STI (sexually transmitted infection).  You have a history of pelvic inflammatory  disease (PID).  You have scarring from endometriosis.  You have multiple sexual partners.  You smoke. Low risk  You have had pelvic surgery.  You use vaginal douches.  You became sexually active before age 6. What are the signs or symptoms? Common symptoms of this condition include normal pregnancy symptoms, such as missing a period, nausea, tiredness, abdominal pain, breast tenderness, and bleeding. However, ectopic pregnancy will have additional symptoms, such as:  Pain with intercourse.  Irregular vaginal bleeding or spotting.  Cramping or pain on one side or in the lower abdomen.  Fast heartbeat, low blood pressure, and sweating.  Passing out while having a bowel movement.  Symptoms of a ruptured ectopic pregnancy and internal bleeding may include:  Sudden, severe pain in the abdomen and pelvis.  Dizziness, weakness, light-headedness, or fainting.  Pain in the shoulder or neck area.  How is this diagnosed? This condition is diagnosed by:  A pelvic exam to locate pain or a mass in the abdomen.  A pregnancy test. This blood test checks for the presence as well as the specific level of pregnancy hormone in the bloodstream.  Ultrasound. This is performed if a pregnancy test is positive. In this test, a probe is inserted into the vagina. The probe will detect a fetus, possibly in a location other than the uterus.  Taking a sample of uterus tissue (dilation and curettage, or D&C).  Surgery to perform a visual exam of the inside of the abdomen using a thin, lighted tube that has a tiny camera on the end (laparoscope).  Culdocentesis. This procedure involves inserting a needle at the  top of the vagina, behind the uterus. If blood is present in this area, it may indicate that a fallopian tube is torn. How is this treated? This condition is treated with medicine or surgery. Your condition required surgery. Surgery  A laparoscope may be used to remove the pregnancy  tissue.  Part or all of the fallopian tube may be removed (salpingectomy) along with the fetus and placenta. The fallopian tube may also be repaired during the surgery.  After surgery, pregnancy hormone testing may be done to be sure that there is no pregnancy tissue left. Follow these instructions at home:  Rest and limit your activity after the procedure for as long as told by your health care provider.  Until your health care provider says that it is safe: ? Avoid physical exercise and any movement that requires effort (is strenuous).  To help prevent constipation: ? Eat a healthy diet that includes fruits, vegetables, and whole grains. ? Drink 6-8 glasses of water per day. Get help right away if:  You develop worsening pain that is not relieved by medicine.  You have: ? A fever or chills. ? Vaginal bleeding. ? Redness and swelling at the incision site. ? Nausea and vomiting.  You feel dizzy or weak.  You feel light-headed or you faint. This information is not intended to replace advice given to you by your health care provider. Make sure you discuss any questions you have with your health care provider. Document Released: 02/26/2004 Document Revised: 09/17/2015 Document Reviewed: 08/20/2015 Elsevier Interactive Patient Education  2018 [employer].   DISCHARGE INSTRUCTIONS: Laparoscopy  The following instructions have been prepared to help you care for yourself upon your return home today.  Wound care:  Do not get the incision wet for the first 24 hours. The incision should be kept clean and dry.  Should the incision become sore, red, and swollen after the first week, check with your doctor.  Personal hygiene:  Shower the day after your procedure.  Activity and limitations:  Do NOT drive or operate any equipment today.  Do NOT lift anything more than 15 pounds for 2-3 weeks after surgery.  Do NOT rest in bed all day.  Walking is encouraged. Walk each day,  starting slowly with 5-minute walks 3 or 4 times a day. Slowly increase the length of your walks.  Walk up and down stairs slowly.  Do NOT do strenuous activities, such as golfing, playing tennis, bowling, running, biking, weight lifting, gardening, mowing, or vacuuming for 2-4 weeks. Ask your doctor when it is okay to start.  Diet: Start with liquid foods such as gelatin or soup. Progress to regular foods as tolerated. Avoid greasy, spicy, heavy foods. If nausea and/or vomiting occur, drink only clear liquids until the nausea and/or vomiting subsides. Call your physician if vomiting continues.  Return to work: This is dependent on the type of work you do. For the most part you can return to a desk job within a week of surgery. If you are more active at work, please discuss this with your doctor.  What to expect after your surgery: You may have a slight burning sensation when you urinate on the first day. You may have a very small amount of blood in the urine. Expect to have a small amount of vaginal discharge/light bleeding for 1-2 weeks. It is not unusual to have abdominal soreness and bruising for up to 2 weeks. You may be tired and need more rest for about 1  week. You may experience shoulder pain for 24-72 hours. Lying flat in bed may relieve it.  Call your doctor for any of the following:  Develop a fever of 100.4 or greater  Inability to urinate 6 hours after discharge from hospital  Severe pain not relieved by pain medications  Persistent of heavy bleeding at incision site  Redness or swelling around incision site after a week  Increasing nausea or vomiting   Post Anesthesia Home Care Instructions  Activity: Get plenty of rest for the remainder of the day. A responsible individual must stay with you for 24 hours following the procedure.  For the next 24 hours, DO NOT: -Drive a car -Advertising copywriter -Drink alcoholic beverages -Take any medication unless instructed by your  physician -Make any legal decisions or sign important papers.  Special Instructions/Symptoms: Your throat may feel dry or sore from the anesthesia or the breathing tube placed in your throat during surgery. If this causes discomfort, gargle with warm salt water. The discomfort should disappear within 24 hours.

## 2017-11-03 NOTE — MAU Note (Signed)
Pt reports she is 5 weeks preg and had some spotting this am.

## 2017-11-04 ENCOUNTER — Encounter (HOSPITAL_COMMUNITY): Payer: Self-pay | Admitting: Obstetrics & Gynecology

## 2017-11-04 LAB — HIV ANTIBODY (ROUTINE TESTING W REFLEX): HIV Screen 4th Generation wRfx: NONREACTIVE

## 2017-11-04 LAB — GC/CHLAMYDIA PROBE AMP (~~LOC~~) NOT AT ARMC
Chlamydia: NEGATIVE
Neisseria Gonorrhea: NEGATIVE

## 2017-11-04 NOTE — Anesthesia Postprocedure Evaluation (Signed)
Anesthesia Post Note  Patient: Julie Ellison  Procedure(s) Performed: DIAGNOSTIC LAPAROSCOPY, Right Salpingectomy with removal ECTOPIC PREGNANCY (N/A Abdomen)     Patient location during evaluation: PACU Anesthesia Type: General Level of consciousness: awake and alert Pain management: pain level controlled Vital Signs Assessment: post-procedure vital signs reviewed and stable Respiratory status: spontaneous breathing, nonlabored ventilation, respiratory function stable and patient connected to nasal cannula oxygen Cardiovascular status: blood pressure returned to baseline and stable Postop Assessment: no apparent nausea or vomiting Anesthetic complications: no    Last Vitals:  Vitals:   11/03/17 2115 11/03/17 2155  BP: 133/89 132/64  Pulse: (!) 104 89  Resp: 17 16  Temp:  36.7 C  SpO2: 96% 98%    Last Pain:  Vitals:   11/03/17 2155  TempSrc:   PainSc: 3    Pain Goal: Patients Stated Pain Goal: 4 (11/03/17 2155)               Belford Pascucci L Venida Tsukamoto

## 2017-11-21 ENCOUNTER — Ambulatory Visit (INDEPENDENT_AMBULATORY_CARE_PROVIDER_SITE_OTHER): Payer: Self-pay | Admitting: Obstetrics & Gynecology

## 2017-11-21 ENCOUNTER — Encounter: Payer: Self-pay | Admitting: Obstetrics & Gynecology

## 2017-11-21 ENCOUNTER — Ambulatory Visit (INDEPENDENT_AMBULATORY_CARE_PROVIDER_SITE_OTHER): Payer: Self-pay | Admitting: Clinical

## 2017-11-21 VITALS — BP 119/56 | HR 66 | Ht 63.0 in | Wt 160.9 lb

## 2017-11-21 DIAGNOSIS — Z9889 Other specified postprocedural states: Secondary | ICD-10-CM

## 2017-11-21 DIAGNOSIS — F4321 Adjustment disorder with depressed mood: Secondary | ICD-10-CM

## 2017-11-21 DIAGNOSIS — Z3009 Encounter for other general counseling and advice on contraception: Secondary | ICD-10-CM

## 2017-11-21 DIAGNOSIS — F329 Major depressive disorder, single episode, unspecified: Secondary | ICD-10-CM

## 2017-11-21 MED ORDER — NORETHIN ACE-ETH ESTRAD-FE 1-20 MG-MCG(24) PO TABS: 1.0000 | ORAL_TABLET | Freq: Every day | ORAL | 11 refills | Status: DC

## 2017-11-21 NOTE — BH Specialist Note (Signed)
Integrated Behavioral Health Initial Visit  MRN: [id number] Name: Julie Ellison  Number of Integrated Behavioral Health Clinician visits:: 1/6 Session Start time: 4:30 Session End time: 5:09 Total time: 35 minutes  Type of Service: Integrated Behavioral Health- Individual/Family Interpretor:No. Interpretor Name and Language: n/a   Warm Hand Off Completed.       SUBJECTIVE: Julie Ellison is a 22 y.o. female accompanied by n/a Patient was referred by Willodean Rosenthal, MD for emotions after ectopic pregnancy Patient reports the following symptoms/concerns: Pt states her primary concern is overwhelming and conflicting emotion as she processes her loss, after experiencing an ectopic pregnancy. Pt has previously been treated for depression with medication, is currently uninsured, and requests information about where she may go for ongoing treatment, if her symptoms do not resolve before she returns to work.  Duration of problem: About 2 weeks; Severity of problem: moderate  OBJECTIVE: Mood: Depressed and Affect: Tearful Risk of harm to self or others: No plan to harm self or others  LIFE CONTEXT: Family and Social: Pt lives with her husband; her family lives in [place] (they call daily, and are very close) School/Work: Uncertainty about returning to work Self-Care: Beginning to recognize a greater need for self-care Life Changes: Move to [place] from [place] two years ago; ectopic pregnancy  GOALS ADDRESSED: Patient will: 1. Reduce symptoms of: anxiety and depression 2. Increase knowledge and/or ability of: self-management skills  3. Demonstrate ability to: Begin healthy grieving over loss  INTERVENTIONS: Interventions utilized: Copywriter, advertising, Supportive Counseling and Psychoeducation and/or Health Education  Standardized Assessments completed: GAD-7 and PHQ 9  ASSESSMENT: Patient currently experiencing Grief.   Patient may benefit from  psychoeducation and brief therapeutic interventions regarding coping with symptoms of depression and anxiety, related to current grief. Marland Kitchen  PLAN: 1. Follow up with behavioral health clinician on : As needed 2. Behavioral recommendations:  -Continue talking to parents and extended family daily -CALM relaxation breathing exercise twice daily(morning; at bedtime); prior to going into "triggering" locations -Use sleep sound app or replicate "fish tank" sound for improved sleep during grieving time -Consider establishing care at [facility] [place] for ongoing therapy, as needed 3. Referral(s): Integrated Art gallery manager (In Clinic) and [facility] (LME/Outside Clinic) 4. "From scale of 1-10, how likely are you to follow plan?": 10  Rae Lips, LCSW  Depression screen [facility] 2/9 11/21/2017  Decreased Interest 1  Down, Depressed, Hopeless 1  PHQ - 2 Score 2  Altered sleeping 3  Tired, decreased energy 0  Change in appetite 1  Feeling bad or failure about yourself  0  Trouble concentrating 0  Moving slowly or fidgety/restless 0  Suicidal thoughts 0  PHQ-9 Score 6   GAD 7 : Generalized Anxiety Score 11/21/2017  Nervous, Anxious, on Edge 1  Control/stop worrying 1  Worry too much - different things 0  Trouble relaxing 1  Restless 0  Easily annoyed or irritable 2  Afraid - awful might happen 0  Total GAD 7 Score 5

## 2017-11-21 NOTE — Progress Notes (Signed)
History:  22 y.o. G1P0 here today for f/u of right salpingectomy due to ectopic pregnancy. Pt denies problems. She is voiding well and has min pain.  Pt reports that she has been dealing with a fair amount of sadness since the surgery.  She wants to begin birth control- specifically OCPs.    The following portions of the patient's history were reviewed and updated as appropriate: allergies, current medications, past family history, past medical history, past social history, past surgical history and problem list.  Review of Systems:  Pertinent items are noted in HPI.    Objective:  Physical Exam Last menstrual period 09/24/2017.  BP (!) 119/56   Pulse 66   Ht 5\' 3"  (1.6 m)   Wt 160 lb 14.4 oz (73 kg)   LMP 09/24/2017   BMI 28.50 kg/m   CONSTITUTIONAL: Well-developed, well-nourished female in no acute distress.  HENT:  Normocephalic, atraumatic EYES: Conjunctivae and EOM are normal. No scleral icterus.  NECK: Normal range of motion SKIN: Skin is warm and dry. No rash noted. Not diaphoretic.No pallor. NEUROLGIC: Alert and oriented to person, place, and time. Normal coordination.   Abd: Soft, nontender and nondistended; port sites healing well.   Pelvic: deferred  Labs and Imaging  Assessment & Plan:  2 week post op check- doing well  Situation depression   Will have behavioral health consult today  Contraception   Pt desires OCPs    Julie Ellison, M.D., [place]

## 2017-11-25 ENCOUNTER — Encounter: Payer: Self-pay | Admitting: Obstetrics & Gynecology

## 2017-12-26 ENCOUNTER — Encounter (HOSPITAL_COMMUNITY): Payer: Self-pay | Admitting: Emergency Medicine

## 2017-12-26 ENCOUNTER — Emergency Department (HOSPITAL_COMMUNITY): Payer: Self-pay

## 2017-12-26 ENCOUNTER — Emergency Department (HOSPITAL_COMMUNITY)
Admission: EM | Admit: 2017-12-26 | Discharge: 2017-12-26 | Disposition: A | Discharge status: Home / Self Care | Payer: Self-pay | Attending: Emergency Medicine | Admitting: Emergency Medicine

## 2017-12-26 DIAGNOSIS — F17200 Nicotine dependence, unspecified, uncomplicated: Secondary | ICD-10-CM | POA: Insufficient documentation

## 2017-12-26 DIAGNOSIS — F419 Anxiety disorder, unspecified: Secondary | ICD-10-CM | POA: Insufficient documentation

## 2017-12-26 DIAGNOSIS — R0989 Other specified symptoms and signs involving the circulatory and respiratory systems: Secondary | ICD-10-CM | POA: Insufficient documentation

## 2017-12-26 DIAGNOSIS — K228 Other specified diseases of esophagus: Secondary | ICD-10-CM

## 2017-12-26 DIAGNOSIS — R198 Other specified symptoms and signs involving the digestive system and abdomen: Secondary | ICD-10-CM

## 2017-12-26 DIAGNOSIS — Z79899 Other long term (current) drug therapy: Secondary | ICD-10-CM | POA: Insufficient documentation

## 2017-12-26 MED ORDER — ALUM & MAG HYDROXIDE-SIMETH 200-200-20 MG/5ML PO SUSP
30.0000 mL | Freq: Once | ORAL | Status: AC
  Administered 2017-12-26: 30 mL via ORAL
  Filled 2017-12-26: qty 30

## 2017-12-26 MED ORDER — LIDOCAINE VISCOUS HCL 2 % MT SOLN
15.0000 mL | Freq: Once | OROMUCOSAL | Status: AC
  Administered 2017-12-26: 15 mL via ORAL
  Filled 2017-12-26: qty 15

## 2017-12-26 NOTE — ED Provider Notes (Signed)
[facility] EMERGENCY DEPARTMENT Provider Note   CSN: [id number] Arrival date & time: 12/26/17  0046     History   Chief Complaint Chief Complaint  Patient presents with  . Foreign Body    HPI Julie Ellison is a 22 y.o. female with no major medical problems presents to the Emergency Department complaining of acute, persistent, feeling of foreign body in the throat onset 2-3 hours PTA.  Pt reports she was laying supine on the couch eating a lollipop when she bit off a piece and accidentally swallowed it.  Pt reports pain in her anterior throat and a feeling of foreign body since that time.  She reports attempting to drink water without difficulty.  No vomiting or SOB.  Nothing makes it better or worse.  Pt is very anxious about this.      The history is provided by the patient and medical records. No language interpreter was used.    Past Medical History:  Diagnosis Date  . Hx of gonorrhea 2017    Patient Active Problem List   Diagnosis Date Noted  . 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 Vaginal bleeding in pregnancy, first trimester     Past Surgical History:  Procedure Laterality Date  . DIAGNOSTIC LAPAROSCOPY WITH REMOVAL OF ECTOPIC PREGNANCY N/A 11/03/2017   Procedure: DIAGNOSTIC LAPAROSCOPY, Right Salpingectomy with removal ECTOPIC PREGNANCY;  Surgeon: Willodean Rosenthal, MD;  Location: [facility];  Service: Gynecology;  Laterality: N/A;  . NO PAST SURGERIES       OB History    Gravida  1   Para      Term      Preterm      AB  0   Living        SAB      TAB      Ectopic  0   Multiple      Live Births               Home Medications    Prior to Admission medications   Medication Sig Start Date End Date Taking? Authorizing Provider  ibuprofen (ADVIL,MOTRIN) 600 MG tablet Take 1 tablet (600 mg total) by mouth every 6 (six) hours as needed. Patient taking differently: Take 600 mg by  mouth every 6 (six) hours as needed for moderate pain.  11/03/17  Yes Willodean Rosenthal, MD  Norethindrone Acetate-Ethinyl Estrad-FE (LOESTRIN 24 FE) 1-20 MG-MCG(24) tablet Take 1 tablet by mouth daily. 11/21/17  Yes Willodean Rosenthal, MD    Family History No family history on file.  Social History Social History   Tobacco Use  . Smoking status: Current Some Day Smoker  . Smokeless tobacco: Never Used  Substance Use Topics  . Alcohol use: Not Currently  . Drug use: Never     Allergies   Patient has no known allergies.   Review of Systems Review of Systems  Constitutional: Negative for appetite change, diaphoresis, fatigue, fever and unexpected weight change.  HENT: Positive for sore throat and trouble swallowing. Negative for mouth sores.   Eyes: Negative for visual disturbance.  Respiratory: Negative for cough, chest tightness, shortness of breath, wheezing and stridor.   Cardiovascular: Negative for chest pain.  Gastrointestinal: Negative for abdominal pain, constipation, diarrhea, nausea and vomiting.  Endocrine: Negative for polydipsia, polyphagia and polyuria.  Genitourinary: Negative for dysuria, frequency, hematuria and urgency.  Musculoskeletal: Negative for back pain and neck stiffness.  Skin: Negative for rash.  Allergic/Immunologic: Negative for  immunocompromised state.  Neurological: Negative for syncope, light-headedness and headaches.  Hematological: Does not bruise/bleed easily.  Psychiatric/Behavioral: Negative for sleep disturbance. The patient is not nervous/anxious.      Physical Exam Updated Vital Signs BP 121/74   Pulse (!) 124   Temp 98 F (36.7 C) (Oral)   Resp 18   Ht 5\' 8"  (1.727 m)   Wt 71.7 kg   LMP 12/19/2017   SpO2 99%   BMI 24.02 kg/m   Physical Exam  Constitutional: She appears well-developed and well-nourished. No distress.  Awake, alert, nontoxic appearance  HENT:  Head: Normocephalic and atraumatic.    Mouth/Throat: Oropharynx is clear and moist. No oropharyngeal exudate.  Eyes: Conjunctivae are normal. No scleral icterus.  Neck: Normal range of motion. Neck supple.  No stridor Handing secretions without difficulty No subcutaneous air or crepitus noted No thyromegaly  Cardiovascular: Normal rate, regular rhythm and intact distal pulses.  Pulmonary/Chest: Effort normal and breath sounds normal. No respiratory distress. She has no wheezes.  Equal chest expansion  Abdominal: Soft. Bowel sounds are normal. She exhibits no mass. There is no tenderness. There is no rebound and no guarding.  Musculoskeletal: Normal range of motion. She exhibits no edema.  Lymphadenopathy:    She has no cervical adenopathy.  Neurological: She is alert.  Speech is clear and goal oriented Moves extremities without ataxia  Skin: Skin is warm and dry. She is not diaphoretic.  Psychiatric: She has a normal mood and affect.  Nursing note and vitals reviewed.    ED Treatments / Results   Radiology Ct Soft Tissue Neck Wo Contrast  Result Date: 12/26/2017 CLINICAL DATA:  Possible foreign body EXAM: CT NECK WITHOUT CONTRAST TECHNIQUE: Multidetector CT imaging of the neck was performed following the standard protocol without intravenous contrast. COMPARISON:  None. FINDINGS: PHARYNX AND LARYNX: --Nasopharynx: Adenoid tonsils are prominent, filling the nasopharynx. --Oral cavity and oropharynx: The palatine and lingual tonsils are normal. The visible oral cavity and floor of mouth are normal. --Hypopharynx: Normal vallecula and pyriform sinuses. --Larynx: Normal epiglottis and pre-epiglottic space. Normal aryepiglottic and vocal folds. --Retropharyngeal space: No abscess, effusion or lymphadenopathy. SALIVARY GLANDS: --Parotid: No mass lesion or inflammation. No sialolithiasis or ductal dilatation. --Submandibular: Symmetric without inflammation. No sialolithiasis or ductal dilatation. --Sublingual: Normal. No ranula or  other visible lesion of the base of tongue and floor of mouth. THYROID: Normal. LYMPH NODES: No enlarged or abnormal density lymph nodes. VASCULAR: Major cervical vessels are patent. LIMITED INTRACRANIAL: Normal. VISUALIZED ORBITS: Normal. MASTOIDS AND VISUALIZED PARANASAL SINUSES: No fluid levels or advanced mucosal thickening. No mastoid effusion. SKELETON: No bony spinal canal stenosis. No lytic or blastic lesions. UPPER CHEST: Clear. OTHER: No radiopaque foreign body. IMPRESSION: No radiopaque foreign body visualized along the aero digestive tract. Electronically Signed   By: Deatra RobinsonKevin  Herman M.D.   On: 12/26/2017 02:10    Procedures Procedures (including critical care time)  Medications Ordered in ED Medications  alum & mag hydroxide-simeth (MAALOX/MYLANTA) 200-200-20 MG/5ML suspension 30 mL (30 mLs Oral Given 12/26/17 0313)    And  lidocaine (XYLOCAINE) 2 % viscous mouth solution 15 mL (15 mLs Oral Given 12/26/17 0313)     Initial Impression / Assessment and Plan / ED Course  I have reviewed the triage vital signs and the nursing notes.  Pertinent labs & imaging results that were available during my care of the patient were reviewed by me and considered in my medical decision making (see chart for details).  Clinical  Course as of Dec 26 317  Mon Dec 26, 2017  1610 Pt tachycardic on arrival and very anxious.  HR has improved while here in the ED  Pulse Rate(!): 138 [HM]    Clinical Course User Index [HM] Keva Darty, Boyd Kerbs    Patient presents with a sensation of foreign body after swallowing a piece of lollipop.  She is alert and oriented.  No stridor, handling secretions without difficulty.  No subcutaneous air.  Doubt esophageal perforation or obstructive foreign body however due to patient's severe anxiety and concern for foreign body, CT scan ordered.  I personally evaluated these images.  There is no evidence of obstructive foreign body on CT scan.  Patient has calm some  and her heart rate has improved.  She continues to handle her secretions and tolerate p.o. without difficulty.  GI cocktail with lidocaine given to help with foreign body sensation.  Patient will be referred to ENT for further evaluation if sensation persists.  Patient and spouse state understanding and are in agreement with the plan.  Final Clinical Impressions(s) / ED Diagnoses   Final diagnoses:  Sensation of foreign body in esophagus    ED Discharge Orders    None       Mardene Sayer Boyd Kerbs 12/26/17 0323    Dione Booze, MD 12/26/17 [phone]

## 2017-12-26 NOTE — ED Triage Notes (Signed)
Pt states she was laying on the cough eating a sucker about 30 minutes ago and feels like a piece of sucker is lodged deep in throat. Pt appears very anxious, states she has been drinking water attempting to get piece to pass but is unable.

## 2017-12-26 NOTE — Discharge Instructions (Addendum)
1. Medications: usual home medications 2. Treatment: rest, drink plenty of fluids, advance diet slowly starting with soft foods 3. Follow Up: Please followup with ENT in 2-3 days if symptoms persist; Please return to the ER for difficulty breathing, difficulty swallowing, swelling of the neck, fevers, worsening pain or other concerns

## 2018-01-02 ENCOUNTER — Ambulatory Visit: Payer: Self-pay | Admitting: Obstetrics & Gynecology

## 2019-04-06 ENCOUNTER — Inpatient Hospital Stay (HOSPITAL_COMMUNITY): Payer: Self-pay

## 2019-04-06 ENCOUNTER — Other Ambulatory Visit: Payer: Self-pay

## 2019-04-06 ENCOUNTER — Encounter (HOSPITAL_COMMUNITY): Payer: Self-pay | Admitting: *Deleted

## 2019-04-06 ENCOUNTER — Inpatient Hospital Stay (HOSPITAL_COMMUNITY)
Admission: EM | Admit: 2019-04-06 | Discharge: 2019-04-07 | Disposition: A | Discharge status: Home / Self Care | Payer: Self-pay | Attending: Obstetrics & Gynecology | Admitting: Obstetrics & Gynecology

## 2019-04-06 DIAGNOSIS — O3680X0 Pregnancy with inconclusive fetal viability, not applicable or unspecified: Secondary | ICD-10-CM

## 2019-04-06 DIAGNOSIS — Z3A01 Less than 8 weeks gestation of pregnancy: Secondary | ICD-10-CM | POA: Insufficient documentation

## 2019-04-06 DIAGNOSIS — R1032 Left lower quadrant pain: Secondary | ICD-10-CM | POA: Insufficient documentation

## 2019-04-06 DIAGNOSIS — O26891 Other specified pregnancy related conditions, first trimester: Secondary | ICD-10-CM | POA: Insufficient documentation

## 2019-04-06 DIAGNOSIS — Z87891 Personal history of nicotine dependence: Secondary | ICD-10-CM | POA: Insufficient documentation

## 2019-04-06 DIAGNOSIS — O0910 Supervision of pregnancy with history of ectopic or molar pregnancy, unspecified trimester: Secondary | ICD-10-CM

## 2019-04-06 DIAGNOSIS — N760 Acute vaginitis: Secondary | ICD-10-CM

## 2019-04-06 DIAGNOSIS — B9689 Other specified bacterial agents as the cause of diseases classified elsewhere: Secondary | ICD-10-CM

## 2019-04-06 LAB — WET PREP, GENITAL
Sperm: NONE SEEN
Trich, Wet Prep: NONE SEEN
Yeast Wet Prep HPF POC: NONE SEEN

## 2019-04-06 LAB — I-STAT BETA HCG BLOOD, ED (MC, WL, AP ONLY): I-stat hCG, quantitative: 384.4 m[IU]/mL — ABNORMAL HIGH (ref <5)

## 2019-04-06 LAB — CBC
HCT: 42.7 % (ref 36.0–46.0)
Hemoglobin: 14.5 g/dL (ref 12.0–15.0)
MCH: 30.3 pg (ref 26.0–34.0)
MCHC: 34 g/dL (ref 30.0–36.0)
MCV: 89.3 fL (ref 80.0–100.0)
Platelets: 269 10*3/uL (ref 150–400)
RBC: 4.78 MIL/uL (ref 3.87–5.11)
RDW: 11.5 % (ref 11.5–15.5)
WBC: 11 10*3/uL — ABNORMAL HIGH (ref 4.0–10.5)
nRBC: 0 % (ref 0.0–0.2)

## 2019-04-06 LAB — URINALYSIS, ROUTINE W REFLEX MICROSCOPIC
Bilirubin Urine: NEGATIVE
Glucose, UA: NEGATIVE mg/dL
Ketones, ur: 20 mg/dL — AB
Leukocytes,Ua: NEGATIVE
Nitrite: NEGATIVE
Protein, ur: NEGATIVE mg/dL
Specific Gravity, Urine: 1.025 (ref 1.005–1.030)
pH: 6 (ref 5.0–8.0)

## 2019-04-06 LAB — COMPREHENSIVE METABOLIC PANEL
ALT: 19 U/L (ref 0–44)
AST: 22 U/L (ref 15–41)
Albumin: 4 g/dL (ref 3.5–5.0)
Alkaline Phosphatase: 68 U/L (ref 38–126)
Anion gap: 7 (ref 5–15)
BUN: 12 mg/dL (ref 6–20)
CO2: 24 mmol/L (ref 22–32)
Calcium: 9.4 mg/dL (ref 8.9–10.3)
Chloride: 104 mmol/L (ref 98–111)
Creatinine, Ser: 0.56 mg/dL (ref 0.44–1.00)
GFR calc Af Amer: >60 mL/min (ref >60)
GFR calc non Af Amer: >60 mL/min (ref >60)
Glucose, Bld: 97 mg/dL (ref 70–99)
Potassium: 3.7 mmol/L (ref 3.5–5.1)
Sodium: 135 mmol/L (ref 135–145)
Total Bilirubin: 0.6 mg/dL (ref 0.3–1.2)
Total Protein: 6.9 g/dL (ref 6.5–8.1)

## 2019-04-06 LAB — LIPASE, BLOOD: Lipase: 21 U/L (ref 11–51)

## 2019-04-06 MED ORDER — SODIUM CHLORIDE 0.9% FLUSH: 3.0000 mL | Freq: Once | INTRAVENOUS | Status: DC

## 2019-04-06 NOTE — ED Provider Notes (Signed)
MSE was initiated and I personally evaluated the patient and placed orders (if any) at  10:34 PM on April 06, 2019.  23 y/o G2P0 female with hx of R tubal pregnancy in 11/2017 s/p R salpingectomy presents to the ED for LLQ/L suprapubic cramping. Cramping present x 3 days, intermittent. Patient began experiencing sharp pains this afternoon which wax and wane. No medications taken PTA. Took home pregnancy test 3 days ago and is concerned for recurrent ectopic. Denies fever, dysuria, hematuria, vaginal discharge, bowel changes. No current OBGYN. LMP 03/08/2019.  Case discussed with RN midwife at [facility]. They will evaluate at their facility for r/o ectopic.  The patient appears stable so that the remainder of the MSE may be completed by another provider.   Antony Madura, PA-C 04/06/19 2238    Lorre Nick, MD 04/06/19 2240

## 2019-04-06 NOTE — ED Triage Notes (Signed)
The pt is c/o abd pain for 3 days  lmp marc 4th  No n or v

## 2019-04-06 NOTE — [facility]U Provider Note (Signed)
History     CSN: [id number]  Arrival date and time: 04/06/19 2043   First Provider Initiated Contact with Patient 04/06/19 2329      Chief Complaint  Patient presents with  . Abdominal Pain   Julie Ellison is a 24 y.o. G2P0010 at [redacted]w[redacted]d by definite LMP of Feb 4th.  She presents today for Abdominal Pain that started today.  She states she took a home UPT ago with positive results.  She states today she started having intermittent sharp stabbing/shooting pain in her lower left quadrant.  Patient reports she has a history of ectopic pregnancy in 2019 with right side salpingectomy.  Patient states she has not taken anything for the pain and denies any vaginal bleeding or discharge. Patient rates pain a 5/10.      OB History    Gravida  2   Para      Term      Preterm      AB  1   Living  0     SAB      TAB      Ectopic  1   Multiple      Live Births  0           Past Medical History:  Diagnosis Date  . Hx of gonorrhea 2017    Past Surgical History:  Procedure Laterality Date  . DIAGNOSTIC LAPAROSCOPY WITH REMOVAL OF ECTOPIC PREGNANCY N/A 11/03/2017   Procedure: DIAGNOSTIC LAPAROSCOPY, Right Salpingectomy with removal ECTOPIC PREGNANCY;  Surgeon: Willodean Rosenthal, MD;  Location: [facility];  Service: Gynecology;  Laterality: N/A;  . NO PAST SURGERIES      History reviewed. No pertinent family history.  Social History   Tobacco Use  . Smoking status: Former Games developer  . Smokeless tobacco: Never Used  Substance Use Topics  . Alcohol use: Not Currently  . Drug use: Never    Allergies: No Known Allergies  Medications Prior to Admission  Medication Sig Dispense Refill Last Dose  . ibuprofen (ADVIL,MOTRIN) 600 MG tablet Take 1 tablet (600 mg total) by mouth every 6 (six) hours as needed. (Patient taking differently: Take 600 mg by mouth every 6 (six) hours as needed for moderate pain. ) 30 tablet 1   . Norethindrone Acetate-Ethinyl Estrad-FE  (LOESTRIN 24 FE) 1-20 MG-MCG(24) tablet Take 1 tablet by mouth daily. 1 Package 11     Review of Systems  Constitutional: Negative for chills and fever.  Respiratory: Negative for cough and shortness of breath.   Gastrointestinal: Positive for abdominal pain. Negative for nausea and vomiting.  Genitourinary: Negative for difficulty urinating, dysuria, vaginal bleeding and vaginal discharge.  Neurological: Negative for dizziness, light-headedness and headaches.   Physical Exam   Blood pressure 133/76, pulse 90, temperature 98.5 F (36.9 C), temperature source Oral, resp. rate 16, height 5\' 3"  (1.6 m), weight 70.3 kg, last menstrual period 03/08/2019, SpO2 100 %, unknown if currently breastfeeding.  Physical Exam  Constitutional: She is oriented to person, place, and time. She appears well-developed and well-nourished. No distress.  HENT:  Head: Normocephalic and atraumatic.  Eyes: Conjunctivae are normal.  Cardiovascular: Normal rate, regular rhythm and normal heart sounds.  Respiratory: Effort normal and breath sounds normal.  GI: Soft. Bowel sounds are normal. There is abdominal tenderness.  Genitourinary:    Genitourinary Comments: GC/CT and Wet Prep collected via Blind Swab BME: No tenderness in cul de sac.  No uterine enlargement. No CMT.    Musculoskeletal:  Cervical back: Normal range of motion.  Neurological: She is alert and oriented to person, place, and time.  Skin: Skin is warm and dry.  Psychiatric: She has a normal mood and affect.    [facility] Course  Procedures Results for orders placed or performed during the hospital encounter of 04/06/19 (from the past 24 hour(s))  Urinalysis, Routine w reflex microscopic     Status: Abnormal   Collection Time: 04/06/19  9:15 PM  Result Value Ref Range   Color, Urine YELLOW YELLOW   APPearance HAZY (A) CLEAR   Specific Gravity, Urine 1.025 1.005 - 1.030   pH 6.0 5.0 - 8.0   Glucose, UA NEGATIVE NEGATIVE mg/dL   Hgb urine  dipstick MODERATE (A) NEGATIVE   Bilirubin Urine NEGATIVE NEGATIVE   Ketones, ur 20 (A) NEGATIVE mg/dL   Protein, ur NEGATIVE NEGATIVE mg/dL   Nitrite NEGATIVE NEGATIVE   Leukocytes,Ua NEGATIVE NEGATIVE   RBC / HPF 0-5 0 - 5 RBC/hpf   WBC, UA 6-10 0 - 5 WBC/hpf   Bacteria, UA RARE (A) NONE SEEN   Squamous Epithelial / LPF 0-5 0 - 5   Mucus PRESENT    Hyaline Casts, UA PRESENT   Lipase, blood     Status: None   Collection Time: 04/06/19  9:20 PM  Result Value Ref Range   Lipase 21 11 - 51 U/L  Comprehensive metabolic panel     Status: None   Collection Time: 04/06/19  9:20 PM  Result Value Ref Range   Sodium 135 135 - 145 mmol/L   Potassium 3.7 3.5 - 5.1 mmol/L   Chloride 104 98 - 111 mmol/L   CO2 24 22 - 32 mmol/L   Glucose, Bld 97 70 - 99 mg/dL   BUN 12 6 - 20 mg/dL   Creatinine, Ser 8.29 0.44 - 1.00 mg/dL   Calcium 9.4 8.9 - 93.7 mg/dL   Total Protein 6.9 6.5 - 8.1 g/dL   Albumin 4.0 3.5 - 5.0 g/dL   AST 22 15 - 41 U/L   ALT 19 0 - 44 U/L   Alkaline Phosphatase 68 38 - 126 U/L   Total Bilirubin 0.6 0.3 - 1.2 mg/dL   GFR calc non Af Amer >60 >60 mL/min   GFR calc Af Amer >60 >60 mL/min   Anion gap 7 5 - 15  CBC     Status: Abnormal   Collection Time: 04/06/19  9:20 PM  Result Value Ref Range   WBC 11.0 (H) 4.0 - 10.5 K/uL   RBC 4.78 3.87 - 5.11 MIL/uL   Hemoglobin 14.5 12.0 - 15.0 g/dL   HCT 16.9 67.8 - 93.8 %   MCV 89.3 80.0 - 100.0 fL   MCH 30.3 26.0 - 34.0 pg   MCHC 34.0 30.0 - 36.0 g/dL   RDW 10.1 75.1 - 02.5 %   Platelets 269 150 - 400 K/uL   nRBC 0.0 0.0 - 0.2 %  hCG, quantitative, pregnancy     Status: Abnormal   Collection Time: 04/06/19  9:20 PM  Result Value Ref Range   hCG, Beta Chain, Quant, S 417 (H) <5 mIU/mL  I-Stat beta hCG blood, ED     Status: Abnormal   Collection Time: 04/06/19  9:40 PM  Result Value Ref Range   I-stat hCG, quantitative 384.4 (H) <5 mIU/mL   Comment 3          Wet prep, genital     Status: Abnormal   Collection Time:  04/06/19 11:32 PM   Specimen: PATH Cytology Cervicovaginal Ancillary Only  Result Value Ref Range   Yeast Wet Prep HPF POC NONE SEEN NONE SEEN   Trich, Wet Prep NONE SEEN NONE SEEN   Clue Cells Wet Prep HPF POC PRESENT (A) NONE SEEN   WBC, Wet Prep HPF POC MANY (A) NONE SEEN   Sperm NONE SEEN    US OB LESS THAN 14 WEEKS WITH OB TRANSVAGINAL  Result Date: 04/07/2019 CLINICAL DATA:  Pain EXAM: OBSTETRIC <14 WK [place] AND TRANSVAGINAL OB US TECHNIQUE: Both transabdominal and transvaginal ultrasound examinations were performed for complete evaluation of the gestation as well as the maternal uterus, adnexal regions, and pelvic cul-de-sac. Transvaginal technique was performed to assess early pregnancy. COMPARISON:  None. FINDINGS: Intrauterine gestational sac: None Maternal uterus/adnexae: There is no significant maternal abnormality. The ovaries are unremarkable. There is no significant free fluid. IMPRESSION: No IUP is visualized. By definition, in the setting of a positive pregnancy test, this reflects a pregnancy of unknown location. Differential considerations include early normal IUP, abnormal IUP/missed abortion, or nonvisualized ectopic pregnancy. Serial beta HCG is suggested. Consider repeat pelvic ultrasound in 14 days. Electronically Signed   By: Constance Holster M.D.   On: 04/07/2019 00:04    MDM Pelvic Exam; Wet Prep and GC/CT Labs:  hCG Ultrasound Assessment and Plan  24 year old G2P0010 at 4.2weeks LLQ Pain Pregnancy of Unknown Anatomical Location History of Ectopic Pregnancy  -Reviewed POC with patient. -Exam performed and findings discussed.  -Cultures collected and pending.  -Pain medication offered and declined. -Prepared patient that [place] results may return without any findings, but that provider will send as it can sometimes detect ectopics despite low hCG levels. -Patient without questions or concerns. -Will send to [place] and await results.    Julie Ellison 04/06/2019, 11:29  PM   Reassessment (12:28 AM) Bacterial Vaginosis  -Results return as above. -Discussed how [place] did not reveal pregnancy location. -Informed of need for repeat quant in 48 hours.  Patient without insurance and scheduled for [place] office on Monday at 0900. -Reviewed BV results and need for treatment.  Q/C addressed. -Rx for metronidazole sent to pharmacy on file. -Patient without further q/c. -Bleeding and Ectopic Precautions given.  -Encouraged to call or return to [facility] if symptoms worsen or with the onset of new symptoms. -Discharged to home in stable condition.  Julie Conners MSN, CNM Advanced Practice Provider, [facility]

## 2019-04-06 NOTE — MAU Note (Addendum)
Pt reports to [facility]U c/o LLQ pain. +UPT. Hx of ectopic pregnancy. No bleeding or discharge. pts last normal period was in December pt did not bleed at all in January but then had bleeding for 3 days in February her normal period is 5 days.

## 2019-04-07 DIAGNOSIS — O3680X0 Pregnancy with inconclusive fetal viability, not applicable or unspecified: Secondary | ICD-10-CM

## 2019-04-07 LAB — HCG, QUANTITATIVE, PREGNANCY: hCG, Beta Chain, Quant, S: 417 m[IU]/mL — ABNORMAL HIGH (ref <5)

## 2019-04-07 MED ORDER — METRONIDAZOLE 500 MG PO TABS: 500.0000 mg | ORAL_TABLET | Freq: Two times a day (BID) | ORAL | 0 refills | Status: DC

## 2019-04-07 NOTE — Discharge Instructions (Signed)
Abdominal Pain During Pregnancy  Abdominal pain is common during pregnancy, and has many possible causes. Some causes are more serious than others, and sometimes the cause is not known. Abdominal pain can be a sign that labor is starting. It can also be caused by normal growth and stretching of muscles and ligaments during pregnancy. Always tell your health care provider if you have any abdominal pain. Follow these instructions at home:  Do not have sex or put anything in your vagina until your pain goes away completely.  Get plenty of rest until your pain improves.  Drink enough fluid to keep your urine pale yellow.  Take over-the-counter and prescription medicines only as told by your health care provider.  Keep all follow-up visits as told by your health care provider. This is important. Contact a health care provider if:  Your pain continues or gets worse after resting.  You have lower abdominal pain that: ? Comes and goes at regular intervals. ? Spreads to your back. ? Is similar to menstrual cramps.  You have pain or burning when you urinate. Get help right away if:  You have a fever or chills.  You have vaginal bleeding.  You are leaking fluid from your vagina.  You are passing tissue from your vagina.  You have vomiting or diarrhea that lasts for more than 24 hours.  Your baby is moving less than usual.  You feel very weak or faint.  You have shortness of breath.  You develop severe pain in your upper abdomen. Summary  Abdominal pain is common during pregnancy, and has many possible causes.  If you experience abdominal pain during pregnancy, tell your health care provider right away.  Follow your health care provider's home care instructions and keep all follow-up visits as directed. This information is not intended to replace advice given to you by your health care provider. Make sure you discuss any questions you have with your health care  provider. Document Revised: 05/08/2018 Document Reviewed: 04/22/2016 Elsevier Patient Education  2020 Elsevier Inc.  

## 2019-04-09 ENCOUNTER — Ambulatory Visit (INDEPENDENT_AMBULATORY_CARE_PROVIDER_SITE_OTHER): Payer: Self-pay

## 2019-04-09 ENCOUNTER — Other Ambulatory Visit: Payer: Self-pay

## 2019-04-09 DIAGNOSIS — O3680X0 Pregnancy with inconclusive fetal viability, not applicable or unspecified: Secondary | ICD-10-CM

## 2019-04-09 LAB — GC/CHLAMYDIA PROBE AMP (~~LOC~~) NOT AT ARMC
Chlamydia: NEGATIVE
Comment: NEGATIVE
Comment: NORMAL
Neisseria Gonorrhea: NEGATIVE

## 2019-04-09 LAB — BETA HCG QUANT (REF LAB): hCG Quant: 1532 m[IU]/mL

## 2019-04-09 NOTE — Progress Notes (Signed)
Agree with A & P. 

## 2019-04-09 NOTE — Progress Notes (Signed)
Pt here today for STAT Beta Lab for pregnancy of unknown location.  Pt states that she has had an ectopic in the past and wanted to make sure that she is not having another.  Pt denies any pain or vaginal bleeding.  Pt advised that it will take approximately two hours for results in which I will also call with f/u.  Pt verbalized understanding.   Received beta value from [facility] @ 1019 resulting 1,532.  Notified Dr. Alysia Penna who recommends that pt has an ultrasound scheduled in 7-10 days.  OB US scheduled for 04/20/19 @ 0900.  Results appt scheduled at 1000 at [facility]. Notified pt of beta results and f/u appts.  Pt advised that if she starts bleeding like a period or she starts to have pain again with intensity to please return to [facility] prior to the [place] appt.  Pt verbalized understanding.  Addison Naegeli, RN 04/09/19

## 2019-04-20 ENCOUNTER — Ambulatory Visit (HOSPITAL_COMMUNITY)
Admission: RE | Admit: 2019-04-20 | Discharge: 2019-04-20 | Disposition: A | Discharge status: Home / Self Care | Payer: Self-pay | Source: Ambulatory Visit | Attending: Obstetrics and Gynecology | Admitting: Obstetrics and Gynecology

## 2019-04-20 ENCOUNTER — Other Ambulatory Visit: Payer: Self-pay

## 2019-04-20 ENCOUNTER — Ambulatory Visit (INDEPENDENT_AMBULATORY_CARE_PROVIDER_SITE_OTHER): Payer: Self-pay

## 2019-04-20 DIAGNOSIS — Z3689 Encounter for other specified antenatal screening: Secondary | ICD-10-CM

## 2019-04-20 DIAGNOSIS — O3680X0 Pregnancy with inconclusive fetal viability, not applicable or unspecified: Secondary | ICD-10-CM | POA: Insufficient documentation

## 2019-04-20 NOTE — Progress Notes (Signed)
Pt here today for OB US results for viability.  OB US shows viable pregnancy 5w 6d, EDD 12/15/19, FHR 114 bpm, and a small presumed chorionic bump which was suggestive of 4 week [place] f/u.  Dr. Macon Large notified of results.  Provider recommendation to start PNC, PNV, and to schedule 4 week f/u US.  OB US scheduled for 05/17/19 @ 1000.  Pt notified of provider's recommendation and OB US appt.  List of medicines safe to take in pregnancy given.  Medications/allergies reviewed.  Pt reports that she has an appt scheduled with another OB provider.  I explained to the pt that her pregnancy is good however the [place] to make sure that the growth of the baby is okay.  Pt verbalized understanding.    Julie Naegeli, RN 04/20/19

## 2019-04-20 NOTE — Progress Notes (Signed)
Patient seen and assessed by nursing staff during this encounter. I have reviewed the chart and agree with the documentation and plan.  Bonham Zingale, MD 04/20/2019 11:55 AM    

## 2019-05-17 ENCOUNTER — Ambulatory Visit (HOSPITAL_COMMUNITY)
Admission: RE | Admit: 2019-05-17 | Discharge: 2019-05-17 | Disposition: A | Discharge status: Home / Self Care | Payer: Self-pay | Source: Ambulatory Visit | Attending: Obstetrics & Gynecology | Admitting: Obstetrics & Gynecology

## 2019-05-17 ENCOUNTER — Other Ambulatory Visit: Payer: Self-pay

## 2019-05-17 DIAGNOSIS — Z3689 Encounter for other specified antenatal screening: Secondary | ICD-10-CM | POA: Insufficient documentation

## 2019-06-05 ENCOUNTER — Encounter: Payer: Self-pay | Admitting: General Practice

## 2019-06-12 ENCOUNTER — Telehealth: Payer: Self-pay | Admitting: General Practice

## 2019-06-12 ENCOUNTER — Ambulatory Visit (INDEPENDENT_AMBULATORY_CARE_PROVIDER_SITE_OTHER): Payer: Self-pay | Admitting: *Deleted

## 2019-06-12 ENCOUNTER — Other Ambulatory Visit: Payer: Self-pay

## 2019-06-12 VITALS — BP 127/68 | HR 104 | Temp 98.0°F | Wt 159.4 lb

## 2019-06-12 DIAGNOSIS — Z3A00 Weeks of gestation of pregnancy not specified: Secondary | ICD-10-CM

## 2019-06-12 DIAGNOSIS — R300 Dysuria: Secondary | ICD-10-CM

## 2019-06-12 DIAGNOSIS — O99891 Other specified diseases and conditions complicating pregnancy: Secondary | ICD-10-CM

## 2019-06-12 DIAGNOSIS — Z3480 Encounter for supervision of other normal pregnancy, unspecified trimester: Secondary | ICD-10-CM | POA: Insufficient documentation

## 2019-06-12 LAB — POCT URINALYSIS DIPSTICK OB
Bilirubin, UA: NEGATIVE
Glucose, UA: NEGATIVE
Ketones, UA: NEGATIVE
Leukocytes, UA: NEGATIVE
Nitrite, UA: NEGATIVE
Spec Grav, UA: >=1.03 — AB (ref 1.010–1.025)
Urobilinogen, UA: 0.2 E.U./dL
pH, UA: 6 (ref 5.0–8.0)

## 2019-06-12 NOTE — Progress Notes (Signed)
   PRENATAL INTAKE SUMMARY  Ms. Julie Ellison presents today New OB Nurse Interview.  OB History    Gravida  2   Para      Term      Preterm      AB  1   Living  0     SAB      TAB      Ectopic  1   Multiple      Live Births  0          I have reviewed the patient's medical, obstetrical, social, and family histories, medications, and available lab results.  SUBJECTIVE She complains of dysuria x 1 week. She also noticed burning sensation after sex.  OBJECTIVE Initial nurse interview for history and labs (New OB).  [employer]  EDD: 12/13/19 by LMP GA: [redacted]w[redacted]d G2P0 FHT: 163  GENERAL APPEARANCE: alert, well appearing, in no apparent distress, oriented to person, place and time, well hydrated.   ASSESSMENT Normal pregnancy  PLAN Prenatal care-[facility] Pnl/HIV  OB Urine Culture Urine Dip GC/CT/PAP at next visit with Raelyn Mora, CNM 06/27/2019 HgbEval/SMA/CF ([facility]) Panorama BP monitor given. Has weight scale at home Patient to sign up for Babyscripts  Clovis Pu, RN

## 2019-06-12 NOTE — Telephone Encounter (Signed)
Charity application given to patient. 

## 2019-06-12 NOTE — Patient Instructions (Addendum)
 Genetic Screening Results Information: You are having genetic testing called [facility] today.  It will take approximately 2 weeks before the results are available.  To get your results, you need Internet access to a web browser to search [facility]/MyChart (the direct app on your phone will not give you these results).  Then select Lab Scanned and click on the blue hyper link that says View Image to see your [facility] results.  You can also use the directions on the purple card given to look up your results directly on the [facility] website.   Second Trimester of Pregnancy  The second trimester is from week 14 through week 27 (month 4 through 6). This is often the time in pregnancy that you feel your best. Often times, morning sickness has lessened or quit. You may have more energy, and you may get hungry more often. Your unborn baby is growing rapidly. At the end of the sixth month, he or she is about 9 inches long and weighs about 1 pounds. You will likely feel the baby move between 18 and 20 weeks of pregnancy. Follow these instructions at home: Medicines  Take over-the-counter and prescription medicines only as told by your doctor. Some medicines are safe and some medicines are not safe during pregnancy.  Take a prenatal vitamin that contains at least 600 micrograms (mcg) of folic acid.  If you have trouble pooping (constipation), take medicine that will make your stool soft (stool softener) if your doctor approves. Eating and drinking   Eat regular, healthy meals.  Avoid raw meat and uncooked cheese.  If you get low calcium from the food you eat, talk to your doctor about taking a daily calcium supplement.  Avoid foods that are high in fat and sugars, such as fried and sweet foods.  If you feel sick to your stomach (nauseous) or throw up (vomit): ? Eat 4 or 5 small meals a day instead of 3 large meals. ? Try eating a few soda crackers. ? Drink liquids between meals instead of during  meals.  To prevent constipation: ? Eat foods that are high in fiber, like fresh fruits and vegetables, whole grains, and beans. ? Drink enough fluids to keep your pee (urine) clear or pale yellow. Activity  Exercise only as told by your doctor. Stop exercising if you start to have cramps.  Do not exercise if it is too hot, too humid, or if you are in a place of great height (high altitude).  Avoid heavy lifting.  Wear low-heeled shoes. Sit and stand up straight.  You can continue to have sex unless your doctor tells you not to. Relieving pain and discomfort  Wear a good support bra if your breasts are tender.  Take warm water baths (sitz baths) to soothe pain or discomfort caused by hemorrhoids. Use hemorrhoid cream if your doctor approves.  Rest with your legs raised if you have leg cramps or low back pain.  If you develop puffy, bulging veins (varicose veins) in your legs: ? Wear support hose or compression stockings as told by your doctor. ? Raise (elevate) your feet for 15 minutes, 3-4 times a day. ? Limit salt in your food. Prenatal care  Write down your questions. Take them to your prenatal visits.  Keep all your prenatal visits as told by your doctor. This is important. Safety  Wear your seat belt when driving.  Make a list of emergency phone numbers, including numbers for family, friends, the hospital, and police and   fire departments. General instructions  Ask your doctor about the right foods to eat or for help finding a counselor, if you need these services.  Ask your doctor about local prenatal classes. Begin classes before month 6 of your pregnancy.  Do not use hot tubs, steam rooms, or saunas.  Do not douche or use tampons or scented sanitary pads.  Do not cross your legs for long periods of time.  Visit your dentist if you have not done so. Use a soft toothbrush to brush your teeth. Floss gently.  Avoid all smoking, herbs, and alcohol. Avoid drugs  that are not approved by your doctor.  Do not use any products that contain nicotine or tobacco, such as cigarettes and e-cigarettes. If you need help quitting, ask your doctor.  Avoid cat litter boxes and soil used by cats. These carry germs that can cause birth defects in the baby and can cause a loss of your baby (miscarriage) or stillbirth. Contact a doctor if:  You have mild cramps or pressure in your lower belly.  You have pain when you pee (urinate).  You have bad smelling fluid coming from your vagina.  You continue to feel sick to your stomach (nauseous), throw up (vomit), or have watery poop (diarrhea).  You have a nagging pain in your belly area.  You feel dizzy. Get help right away if:  You have a fever.  You are leaking fluid from your vagina.  You have spotting or bleeding from your vagina.  You have severe belly cramping or pain.  You lose or gain weight rapidly.  You have trouble catching your breath and have chest pain.  You notice sudden or extreme puffiness (swelling) of your face, hands, ankles, feet, or legs.  You have not felt the baby move in over an hour.  You have severe headaches that do not go away when you take medicine.  You have trouble seeing. Summary  The second trimester is from week 14 through week 27 (months 4 through 6). This is often the time in pregnancy that you feel your best.  To take care of yourself and your unborn baby, you will need to eat healthy meals, take medicines only if your doctor tells you to do so, and do activities that are safe for you and your baby.  Call your doctor if you get sick or if you notice anything unusual about your pregnancy. Also, call your doctor if you need help with the right food to eat, or if you want to know what activities are safe for you. This information is not intended to replace advice given to you by your health care provider. Make sure you discuss any questions you have with your health  care provider. Document Revised: 05/12/2018 Document Reviewed: 02/24/2016 Else[employer] Patient Education  2020 [employer]  Warning Signs During Pregnancy A pregnancy lasts about 40 weeks, starting from the first day of your last period until the baby is born. Pregnancy is divided into three phases called trimesters.  The first trimester refers to week 1 through week 13 of pregnancy.  The second trimester is the start of week 14 through the end of week 27.  The third trimester is the start of week 28 until you deliver your baby. During each trimester of pregnancy, certain signs and symptoms may indicate a problem. Talk with your health care provider about your current health and any medical conditions you have. Make sure you know the symptoms that you should watch   for and report. How does this affect me?  Warning signs in the first trimester While some changes during the first trimester may be uncomfortable, most do not represent a serious problem. Let your health care provider know if you have any of the following warning signs in the first trimester:  You cannot eat or drink without vomiting, and this lasts for longer than a day.  You have vaginal bleeding or spotting along with menstrual-like cramping.  You have diarrhea for longer than a day.  You have a fever or other signs of infection, such as: ? Pain or burning when you urinate. ? Foul smelling or thick or yellowish vaginal discharge. Warning signs in the second trimester As your baby grows and changes during the second trimester, there are additional signs and symptoms that may indicate a problem. These include:  Signs and symptoms of infection, including a fever.  Signs or symptoms of a miscarriage or preterm labor, such as regular contractions, menstrual-like cramping, or lower abdominal pain.  Bloody or watery vaginal discharge or obvious vaginal bleeding.  Feeling like your heart is pounding.  Having trouble  breathing.  Nausea, vomiting, or diarrhea that lasts for longer than a day.  Craving non-food items, such as clay, chalk, or dirt. This may be a sign of a very treatable medical condition called pica. Later in your second trimester, watch for signs and symptoms of a serious medical condition called preeclampsia.These include:  Changes in your vision.  A severe headache that does not go away.  Nausea and vomiting. It is also important to notice if your baby stops moving or moves less than usual during this time. Warning signs in the third trimester As you approach the third trimester, your baby is growing and your body is preparing for the birth of your baby. In your third trimester, be sure to let your health care provider know if:  You have signs and symptoms of infection, including a fever.  You have vaginal bleeding.  You notice that your baby is moving less than usual or is not moving.  You have nausea, vomiting, or diarrhea that lasts for longer than a day.  You have a severe headache that does not go away.  You have vision changes, including seeing spots or having blurry or double vision.  You have increased swelling in your hands or face. How does this affect my baby? Throughout your pregnancy, always report any of the warning signs of a problem to your health care provider. This can help prevent complications that may affect your baby, including:  Increased risk for premature birth.  Infection that may be transmitted to your baby.  Increased risk for stillbirth. Contact a health care provider if:  You have any of the warning signs of a problem for the current trimester of your pregnancy.  Any of the following apply to you during any trimester of pregnancy: ? You have strong emotions, such as sadness or anxiety, that interfere with work or personal relationships. ? You feel unsafe in your home and need help finding a safe place to live. ? You are using tobacco  products, alcohol, or drugs and you need help to stop. Get help right away if: You have signs or symptoms of labor before 37 weeks of pregnancy. These include:  Contractions that are 5 minutes or less apart, or that increase in frequency, intensity, or length.  Sudden, sharp abdominal pain or low back pain.  Uncontrolled gush or trickle of fluid from   your vagina. Summary  A pregnancy lasts about 40 weeks, starting from the first day of your last period until the baby is born. Pregnancy is divided into three phases called trimesters. Each trimester has warning signs to watch for.  Always report any warning signs to your health care provider in order to prevent complications that may affect both you and your baby.  Talk with your health care provider about your current health and any medical conditions you have. Make sure you know the symptoms that you should watch for and report. This information is not intended to replace advice given to you by your health care provider. Make sure you discuss any questions you have with your health care provider. Document Revised: 05/09/2018 Document Reviewed: 11/04/2016 Elsevier Patient Education  2020 [employer]  Genetic Testing During Pregnancy Genetic testing during pregnancy is also called prenatal genetic testing. This type of testing can determine if your baby is at risk of being born with a disorder caused by abnormal genes or chromosomes (genetic disorder). Chromosomes contain genes that control how your baby will develop in your womb. There are many different genetic disorders. Examples of genetic disorders that may be found through genetic testing include Down syndrome and cystic fibrosis. Gene changes (mutations) can be passed down through families. Genetic testing is offered to all women before or during pregnancy. You can choose whether to have genetic testing. Why is genetic testing done? Genetic testing is done during pregnancy to find out  whether your child is at risk for a genetic disorder. Having genetic testing allows you to:  Discuss your test results and options with a genetic counselor.  Prepare for a baby that may be born with a genetic disorder. Learning about the disorder ahead of time helps you be better prepared to manage it. Your health care providers can also be prepared in case your baby requires special care before or after birth.  Consider whether you want to continue with the pregnancy. In some cases, genetic testing may be done to learn about the traits a child will inherit. Types of genetic tests There are two basic types of genetic testing. Screening tests indicate whether your developing baby (fetus) is at higher risk for a genetic disorder. Diagnostic tests check actual fetal cells to diagnose a genetic disorder. Screening tests     Screening tests will not harm your baby. They are recommended for all pregnant women. Types of screening tests include:  Carrier screening. This test involves checking genes from both parents by testing their blood or saliva. The test checks to find out if the parents carry a genetic mutation that may be passed to a baby. In most cases, both parents must carry the mutation for a baby to be at risk.  First trimester screening. This test combines a blood test with sound wave imaging of your baby (fetal ultrasound). This screening test checks for a risk of Down syndrome or other defects caused by having extra chromosomes. It also checks for defects of the heart, abdomen, or skeleton.  Second trimester screening also combines a blood test with a fetal ultrasound exam. It checks for a risk of genetic defects of the face, brain, spine, heart, or limbs.  Combined or sequential screening. This type of testing combines the results of first and second trimester screening. This type of testing may be more accurate than first or second trimester screening alone.  Cell-free DNA testing.  This is a blood test that detects cells released by   the placenta that get into the mother's blood. It can be used to check for a risk of Down syndrome, other extra chromosome syndromes, and disorders caused by abnormal numbers of sex chromosomes. This test can be done any time after 10 weeks of pregnancy.  Diagnostic tests Diagnostic tests carry slight risks of problems, including bleeding, infection, and loss of the pregnancy. These tests are done only if your baby is at risk for a genetic disorder. You may meet with a genetic counselor to discuss the risks and benefits before having diagnostic tests. Examples of diagnostic tests include:  Chorionic villus sampling (CVS). This involves a procedure to remove and test a sample of cells taken from the placenta. The procedure may be done between 10 and 12 weeks of pregnancy.  Amniocentesis. This involves a procedure to remove and test a sample of fluid (amniotic fluid) and cells from the sac that surrounds the developing baby. The procedure may be done between 15 and 20 weeks of pregnancy. What do the results mean? For a screening test:  If the results are negative, it often means that your child is not at higher risk. There is still a slight chance your child could have a genetic disorder.  If the results are positive, it does not mean your child will have a genetic disorder. It may mean that your child has a higher-than-normal risk for a genetic disorder. In that case, you may want to talk with a genetic counselor about whether you should have diagnostic genetic tests. For a diagnostic test:  If the result is negative, it is unlikely that your child will have a genetic disorder.  If the test is positive for a genetic disorder, it is likely that your child will have the disorder. The test may not tell how severe the disorder will be. Talk with your health care provider about your options. Questions to ask your health care provider Before talking to  your health care provider about genetic testing, find out if there is a history of genetic disorders in your family. It may also help to know your family's ethnic origins. Then ask your health care provider the following questions:  Is my baby at risk for a genetic disorder?  What are the benefits of having genetic screening?  What tests are best for me and my baby?  What are the risks of each test?  If I get a positive result on a screening test, what is the next step?  Should I meet with a genetic counselor before having a diagnostic test?  Should my partner or other members of my family be tested?  How much do the tests cost? Will my insurance cover the testing? Summary  Genetic testing is done during pregnancy to find out whether your child is at risk for a genetic disorder.  Genetic testing is offered to all women before or during pregnancy. You can choose whether to have genetic testing.  There are two basic types of genetic testing. Screening tests indicate whether your developing baby (fetus) is at higher risk for a genetic disorder. Diagnostic tests check actual fetal cells to diagnose a genetic disorder.  If a diagnostic genetic test is positive, talk with your health care provider about your options. This information is not intended to replace advice given to you by your health care provider. Make sure you discuss any questions you have with your health care provider. Document Revised: 05/11/2018 Document Reviewed: 04/04/2017 Elsevier Patient Education  2020 [employer]  

## 2019-06-13 LAB — OBSTETRIC PANEL, INCLUDING HIV
Antibody Screen: NEGATIVE
Basophils Absolute: 0 10*3/uL (ref 0.0–0.2)
Basos: 0 %
EOS (ABSOLUTE): 0 10*3/uL (ref 0.0–0.4)
Eos: 0 %
HIV Screen 4th Generation wRfx: NONREACTIVE
Hematocrit: 42.5 % (ref 34.0–46.6)
Hemoglobin: 14.3 g/dL (ref 11.1–15.9)
Hepatitis B Surface Ag: NEGATIVE
Immature Grans (Abs): 0.1 10*3/uL (ref 0.0–0.1)
Immature Granulocytes: 1 %
Lymphocytes Absolute: 2.3 10*3/uL (ref 0.7–3.1)
Lymphs: 19 %
MCH: 30.2 pg (ref 26.6–33.0)
MCHC: 33.6 g/dL (ref 31.5–35.7)
MCV: 90 fL (ref 79–97)
Monocytes Absolute: 0.4 10*3/uL (ref 0.1–0.9)
Monocytes: 4 %
Neutrophils Absolute: 9.1 10*3/uL — ABNORMAL HIGH (ref 1.4–7.0)
Neutrophils: 76 %
Platelets: 246 10*3/uL (ref 150–450)
RBC: 4.73 x10E6/uL (ref 3.77–5.28)
RDW: 11.9 % (ref 11.7–15.4)
RPR Ser Ql: NONREACTIVE
Rh Factor: POSITIVE
Rubella Antibodies, IGG: 2.12 index (ref >0.99)
WBC: 11.9 10*3/uL — ABNORMAL HIGH (ref 3.4–10.8)

## 2019-06-13 LAB — HEPATITIS C ANTIBODY: Hep C Virus Ab: <0.1 s/co ratio (ref 0.0–0.9)

## 2019-06-14 ENCOUNTER — Encounter: Payer: Self-pay | Admitting: General Practice

## 2019-06-14 LAB — URINE CULTURE, OB REFLEX

## 2019-06-14 LAB — CULTURE, OB URINE

## 2019-06-21 ENCOUNTER — Encounter: Payer: Self-pay | Admitting: General Practice

## 2019-06-27 ENCOUNTER — Other Ambulatory Visit (HOSPITAL_COMMUNITY)
Admission: RE | Admit: 2019-06-27 | Discharge: 2019-06-27 | Disposition: A | Discharge status: Home / Self Care | Payer: Self-pay | Source: Ambulatory Visit | Attending: Obstetrics and Gynecology | Admitting: Obstetrics and Gynecology

## 2019-06-27 ENCOUNTER — Ambulatory Visit (INDEPENDENT_AMBULATORY_CARE_PROVIDER_SITE_OTHER): Payer: Self-pay | Admitting: Obstetrics and Gynecology

## 2019-06-27 ENCOUNTER — Other Ambulatory Visit: Payer: Self-pay

## 2019-06-27 ENCOUNTER — Encounter: Payer: Self-pay | Admitting: Obstetrics and Gynecology

## 2019-06-27 VITALS — BP 118/73 | HR 118 | Temp 98.7°F | Wt 161.6 lb

## 2019-06-27 DIAGNOSIS — Z3A15 15 weeks gestation of pregnancy: Secondary | ICD-10-CM

## 2019-06-27 DIAGNOSIS — Z124 Encounter for screening for malignant neoplasm of cervix: Secondary | ICD-10-CM

## 2019-06-27 DIAGNOSIS — Z3482 Encounter for supervision of other normal pregnancy, second trimester: Secondary | ICD-10-CM

## 2019-06-27 DIAGNOSIS — Z3480 Encounter for supervision of other normal pregnancy, unspecified trimester: Secondary | ICD-10-CM | POA: Insufficient documentation

## 2019-06-27 NOTE — Patient Instructions (Addendum)
Healthy Weight Gain During Pregnancy, Adult A certain amount of weight gain during pregnancy is normal and healthy. How much weight you should gain depends on your overall health and a measurement called BMI (body mass index). BMI is an estimate of your body fat based on your height and weight. You can use an online calculator to figure out your BMI, or you can ask your health care provider to calculate it for you at your next visit. Your recommended pregnancy weight gain is based on your pre-pregnancy BMI. General guidelines for a healthy total weight gain during pregnancy are listed below. If your BMI at or before the start of your pregnancy is:  Less than 18.5 (underweight), you should gain 28-40 lb (13-18 kg).  18.5-24.9 (normal weight), you should gain 25-35 lb (11-16 kg).  25-29.9 (overweight), you should gain 15-25 lb (7-11 kg).  30 or higher (obese), you should gain 11-20 lb (5-9 kg). These ranges vary depending on your individual health. If you are carrying more than one baby (multiples), it may be safe to gain more weight than these recommendations. If you gain less weight than recommended, that may be safe as long as your baby is growing and developing normally. How can unhealthy weight gain affect me and my baby? Gaining too much weight during pregnancy can lead to pregnancy complications, such as:  A temporary form of diabetes that develops during pregnancy (gestational diabetes).  High blood pressure during pregnancy and protein in your urine (preeclampsia).  High blood pressure during pregnancy without protein in your urine (gestational hypertension).  Your baby having a high weight at birth, which may: ? Raise your risk of having a more difficult delivery or a surgical delivery (cesarean delivery, or C-section). ? Raise your child's risk of developing obesity during childhood. Not gaining enough weight can be life-threatening for your baby, and it may raise your baby's chances  of:  Being born early (preterm).  Growing more slowly than normal during pregnancy (growth restriction).  Having a low weight at birth. What actions can I take to gain a healthy amount of weight during pregnancy? General instructions  Keep track of your weight gain during pregnancy.  Take over-the-counter and prescription medicines only as told by your health care provider. Take all prenatal supplements as directed.  Keep all health care visits during pregnancy (prenatal visits). These visits are a good time to discuss your weight gain. Your health care provider will weigh you at each visit to make sure you are gaining a healthy amount of weight. Nutrition   Eat a balanced, nutrient-rich diet. Eat plenty of: ? Fruits and vegetables, such as berries and broccoli. ? Whole grains, such as millet, barley, whole-wheat breads and cereals, and oatmeal. ? Low-fat dairy products or non-dairy products such as almond milk or rice milk. ? Protein foods, such as lean meat, chicken, eggs, and legumes (such as peas, beans, soybeans, and lentils).  Avoid foods that are fried or have a lot of fat, salt (sodium), or sugar.  Drink enough fluid to keep your urine pale yellow.  Choose healthy snack and drink options when you are at work or on the go: ? Drink water. Avoid soda, sports drinks, and juices that have added sugar. ? Avoid drinks with caffeine, such as coffee and energy drinks. ? Eat snacks that are high in protein, such as nuts, protein bars, and low-fat yogurt. ? Carry convenient snacks in your purse that do not need refrigeration, such as a pack of   trail mix, an apple, or a granola bar.  If you need help improving your diet, work with a health care provider or a diet and nutrition specialist (dietitian). Activity   Exercise regularly, as told by your health care provider. ? If you were active before becoming pregnant, you may be able to continue your regular fitness activities. ? If  you were not active before pregnancy, you may gradually build up to exercising for 30 or more minutes on most days of the week. This may include walking, swimming, or yoga.  Ask your health care provider what activities are safe for you. Talk with your health care provider about whether you may need to be excused from certain school or work activities. Where to find more information Learn more about managing your weight gain during pregnancy from:  American Pregnancy Association: www.americanpregnancy.org  [place] Department of Agriculture pregnancy weight gain calculator: https://ball-collins.biz/ Summary  Too much weight gain during pregnancy can lead to complications for you and your baby.  Find out your pre-pregnancy BMI to determine how much weight gain is healthy for you.  Eat nutritious foods and stay active.  Keep all of your prenatal visits as told by your health care provider. This information is not intended to replace advice given to you by your health care provider. Make sure you discuss any questions you have with your health care provider. Document Revised: 10/11/2018 Document Reviewed: 10/08/2016 Elsevier Patient Education  2020 [employer]. Eating Plan for Pregnant Women While you are pregnant, your body requires additional nutrition to help support your growing baby. You also have a higher need for some vitamins and minerals, such as folic acid, calcium, iron, and vitamin D. Eating a healthy, well-balanced diet is very important for your health and your baby's health. Your need for extra calories varies for the three 12-month segments of your pregnancy (trimesters). For most women, it is recommended to consume:  150 extra calories a day during the first trimester.  300 extra calories a day during the second trimester.  300 extra calories a day during the third trimester. What are tips for following this plan?   Do not try to lose weight or go on a diet during  pregnancy.  Limit your overall intake of foods that have "empty calories." These are foods that have little nutritional value, such as sweets, desserts, candies, and sugar-sweetened beverages.  Eat a variety of foods (especially fruits and vegetables) to get a full range of vitamins and minerals.  Take a prenatal vitamin to help meet your additional vitamin and mineral needs during pregnancy, specifically for folic acid, iron, calcium, and vitamin D.  Remember to stay active. Ask your health care provider what types of exercise and activities are safe for you.  Practice good food safety and cleanliness. Wash your hands before you eat and after you prepare raw meat. Wash all fruits and vegetables well before peeling or eating. Taking these actions can help to prevent food-borne illnesses that can be very dangerous to your baby, such as listeriosis. Ask your health care provider for more information about listeriosis. What does 150 extra calories look like? Healthy options that provide 150 extra calories each day could be any of the following:  6-8 oz (170-230 g) of plain low-fat yogurt with  cup of berries.  1 apple with 2 teaspoons (11 g) of peanut butter.  Cut-up vegetables with  cup (60 g) of hummus.  8 oz (230 mL) or 1 cup of low-fat chocolate  milk.  1 stick of string cheese with 1 medium orange.  1 peanut butter and jelly sandwich that is made with one slice of whole-wheat bread and 1 tsp (5 g) of peanut butter. For 300 extra calories, you could eat two of those healthy options each day. What is a healthy amount of weight to gain? The right amount of weight gain for you is based on your BMI before you became pregnant. If your BMI:  Was less than 18 (underweight), you should gain 28-40 lb (13-18 kg).  Was 18-24.9 (normal), you should gain 25-35 lb (11-16 kg).  Was 25-29.9 (overweight), you should gain 15-25 lb (7-11 kg).  Was 30 or greater (obese), you should gain 11-20 lb  (5-9 kg). What if I am having twins or multiples? Generally, if you are carrying twins or multiples:  You may need to eat 300-600 extra calories a day.  The recommended range for total weight gain is 25-54 lb (11-25 kg), depending on your BMI before pregnancy.  Talk with your health care provider to find out about nutritional needs, weight gain, and exercise that is right for you. What foods can I eat?  Fruits All fruits. Eat a variety of colors and types of fruit. Remember to wash your fruits well before peeling or eating. Vegetables All vegetables. Eat a variety of colors and types of vegetables. Remember to wash your vegetables well before peeling or eating. Grains All grains. Choose whole grains, such as whole-wheat bread, oatmeal, or brown rice. Meats and other protein foods Lean meats, including chicken, [place], fish, and lean cuts of beef, veal, or pork. If you eat fish or seafood, choose options that are higher in omega-3 fatty acids and lower in mercury, such as salmon, herring, mussels, trout, sardines, pollock, shrimp, crab, and lobster. Tofu. Tempeh. Beans. Eggs. Peanut butter and other nut butters. Make sure that all meats, poultry, and eggs are cooked to food-safe temperatures or "well-done." Two or more servings of fish are recommended each week in order to get the most benefits from omega-3 fatty acids that are found in seafood. Choose fish that are lower in mercury. You can find more information online:  [employer]amGaming.ch Dairy Pasteurized milk and milk alternatives (such as almond milk). Pasteurized yogurt and pasteurized cheese. Cottage cheese. Sour cream. Beverages Water. Juices that contain 100% fruit juice or vegetable juice. Caffeine-free teas and decaffeinated coffee. Drinks that contain caffeine are okay to drink, but it is better to avoid caffeine. Keep your total caffeine intake to less than 200 mg each day (which is 12 oz or 355 mL of coffee, tea, or soda) or the limit  as told by your health care provider. Fats and oils Fats and oils are okay to include in moderation. Sweets and desserts Sweets and desserts are okay to include in moderation. Seasoning and other foods All pasteurized condiments. The items listed above may not be a complete list of foods and beverages you can eat. Contact a dietitian for more information. What foods are not recommended? Fruits Unpasteurized fruit juices. Vegetables Raw (unpasteurized) vegetable juices. Meats and other protein foods Lunch meats, bologna, hot dogs, or other deli meats. (If you must eat those meats, reheat them until they are steaming hot.) Refrigerated pat, meat spreads from a meat counter, smoked seafood that is found in the refrigerated section of a store. Raw or undercooked meats, poultry, and eggs. Raw fish, such as sushi or sashimi. Fish that have high mercury content, such as tilefish, shark, swordfish,  and king mackerel. To learn more about mercury in fish, talk with your health care provider or look for online resources, such as:  PumpkinSearch.com.eewww.fda.gov Dairy Raw (unpasteurized) milk and any foods that have raw milk in them. Soft cheeses, such as feta, queso blanco, queso fresco, Brie, Camembert cheeses, blue-veined cheeses, and Panela cheese (unless it is made with pasteurized milk, which must be stated on the label). Beverages Alcohol. Sugar-sweetened beverages, such as sodas, teas, or energy drinks. Seasoning and other foods Homemade fermented foods and drinks, such as pickles, sauerkraut, or kombucha drinks. (Store-bought pasteurized versions of these are okay.) Salads that are made in a store or deli, such as ham salad, chicken salad, egg salad, tuna salad, and seafood salad. The items listed above may not be a complete list of foods and beverages you should avoid. Contact a dietitian for more information. Where to find more information To calculate the number of calories you need based on your height,  weight, and activity level, you can use an online calculator such as:  PackageNews.iswww.choosemyplate.gov/MyPlatePlan To calculate how much weight you should gain during pregnancy, you can use an online pregnancy weight gain calculator such as:  http://jones-berg.com/www.choosemyplate.gov/pregnancy-weight-gain-calculator Summary  While you are pregnant, your body requires additional nutrition to help support your growing baby.  Eat a variety of foods, especially fruits and vegetables to get a full range of vitamins and minerals.  Practice good food safety and cleanliness. Wash your hands before you eat and after you prepare raw meat. Wash all fruits and vegetables well before peeling or eating. Taking these actions can help to prevent food-borne illnesses, such as listeriosis, that can be very dangerous to your baby.  Do not eat raw meat or fish. Do not eat fish that have high mercury content, such as tilefish, shark, swordfish, and king mackerel. Do not eat unpasteurized (raw) dairy.  Take a prenatal vitamin to help meet your additional vitamin and mineral needs during pregnancy, specifically for folic acid, iron, calcium, and vitamin D. This information is not intended to replace advice given to you by your health care provider. Make sure you discuss any questions you have with your health care provider. Document Revised: 06/08/2018 Document Reviewed: 10/15/2016 Elsevier Patient Education  2020 [employer] Second Trimester of Pregnancy The second trimester is from week 14 through week 27 (months 4 through 6). The second trimester is often a time when you feel your best. Your body has adjusted to being pregnant, and you begin to feel better physically. Usually, morning sickness has lessened or quit completely, you may have more energy, and you may have an increase in appetite. The second trimester is also a time when the fetus is growing rapidly. At the end of the sixth month, the fetus is about 9 inches long and weighs about  1 pounds. You will likely begin to feel the baby move (quickening) between 16 and 20 weeks of pregnancy. Body changes during your second trimester Your body continues to go through many changes during your second trimester. The changes vary from woman to woman.  Your weight will continue to increase. You will notice your lower abdomen bulging out.  You may begin to get stretch marks on your hips, abdomen, and breasts.  You may develop headaches that can be relieved by medicines. The medicines should be approved by your health care provider.  You may urinate more often because the fetus is pressing on your bladder.  You may develop or continue to have heartburn as  a result of your pregnancy.  You may develop constipation because certain hormones are causing the muscles that push waste through your intestines to slow down.  You may develop hemorrhoids or swollen, bulging veins (varicose veins).  You may have back pain. This is caused by: ? Weight gain. ? Pregnancy hormones that are relaxing the joints in your pelvis. ? A shift in weight and the muscles that support your balance.  Your breasts will continue to grow and they will continue to become tender.  Your gums may bleed and may be sensitive to brushing and flossing.  Dark spots or blotches (chloasma, mask of pregnancy) may develop on your face. This will likely fade after the baby is born.  A dark line from your belly button to the pubic area (linea nigra) may appear. This will likely fade after the baby is born.  You may have changes in your hair. These can include thickening of your hair, rapid growth, and changes in texture. Some women also have hair loss during or after pregnancy, or hair that feels dry or thin. Your hair will most likely return to normal after your baby is born. What to expect at prenatal visits During a routine prenatal visit:  You will be weighed to make sure you and the fetus are growing  normally.  Your blood pressure will be taken.  Your abdomen will be measured to track your baby's growth.  The fetal heartbeat will be listened to.  Any test results from the previous visit will be discussed. Your health care provider may ask you:  How you are feeling.  If you are feeling the baby move.  If you have had any abnormal symptoms, such as leaking fluid, bleeding, severe headaches, or abdominal cramping.  If you are using any tobacco products, including cigarettes, chewing tobacco, and electronic cigarettes.  If you have any questions. Other tests that may be performed during your second trimester include:  Blood tests that check for: ? Low iron levels (anemia). ? High blood sugar that affects pregnant women (gestational diabetes) between 11 and 28 weeks. ? Rh antibodies. This is to check for a protein on red blood cells (Rh factor).  Urine tests to check for infections, diabetes, or protein in the urine.  An ultrasound to confirm the proper growth and development of the baby.  An amniocentesis to check for possible genetic problems.  Fetal screens for spina bifida and Down syndrome.  HIV (human immunodeficiency virus) testing. Routine prenatal testing includes screening for HIV, unless you choose not to have this test. Follow these instructions at home: Medicines  Follow your health care provider's instructions regarding medicine use. Specific medicines may be either safe or unsafe to take during pregnancy.  Take a prenatal vitamin that contains at least 600 micrograms (mcg) of folic acid.  If you develop constipation, try taking a stool softener if your health care provider approves. Eating and drinking   Eat a balanced diet that includes fresh fruits and vegetables, whole grains, good sources of protein such as meat, eggs, or tofu, and low-fat dairy. Your health care provider will help you determine the amount of weight gain that is right for you.  Avoid  raw meat and uncooked cheese. These carry germs that can cause birth defects in the baby.  If you have low calcium intake from food, talk to your health care provider about whether you should take a daily calcium supplement.  Limit foods that are high in fat and processed  sugars, such as fried and sweet foods.  To prevent constipation: ? Drink enough fluid to keep your urine clear or pale yellow. ? Eat foods that are high in fiber, such as fresh fruits and vegetables, whole grains, and beans. Activity  Exercise only as directed by your health care provider. Most women can continue their usual exercise routine during pregnancy. Try to exercise for 30 minutes at least 5 days a week. Stop exercising if you experience uterine contractions.  Avoid heavy lifting, wear low heel shoes, and practice good posture.  A sexual relationship may be continued unless your health care provider directs you otherwise. Relieving pain and discomfort  Wear a good support bra to prevent discomfort from breast tenderness.  Take warm sitz baths to soothe any pain or discomfort caused by hemorrhoids. Use hemorrhoid cream if your health care provider approves.  Rest with your legs elevated if you have leg cramps or low back pain.  If you develop varicose veins, wear support hose. Elevate your feet for 15 minutes, 3-4 times a day. Limit salt in your diet. Prenatal Care  Write down your questions. Take them to your prenatal visits.  Keep all your prenatal visits as told by your health care provider. This is important. Safety  Wear your seat belt at all times when driving.  Make a list of emergency phone numbers, including numbers for family, friends, the hospital, and police and fire departments. General instructions  Ask your health care provider for a referral to a local prenatal education class. Begin classes no later than the beginning of month 6 of your pregnancy.  Ask for help if you have counseling  or nutritional needs during pregnancy. Your health care provider can offer advice or refer you to specialists for help with various needs.  Do not use hot tubs, steam rooms, or saunas.  Do not douche or use tampons or scented sanitary pads.  Do not cross your legs for long periods of time.  Avoid cat litter boxes and soil used by cats. These carry germs that can cause birth defects in the baby and possibly loss of the fetus by miscarriage or stillbirth.  Avoid all smoking, herbs, alcohol, and unprescribed drugs. Chemicals in these products can affect the formation and growth of the baby.  Do not use any products that contain nicotine or tobacco, such as cigarettes and e-cigarettes. If you need help quitting, ask your health care provider.  Visit your dentist if you have not gone yet during your pregnancy. Use a soft toothbrush to brush your teeth and be gentle when you floss. Contact a health care provider if:  You have dizziness.  You have mild pelvic cramps, pelvic pressure, or nagging pain in the abdominal area.  You have persistent nausea, vomiting, or diarrhea.  You have a bad smelling vaginal discharge.  You have pain when you urinate. Get help right away if:  You have a fever.  You are leaking fluid from your vagina.  You have spotting or bleeding from your vagina.  You have severe abdominal cramping or pain.  You have rapid weight gain or weight loss.  You have shortness of breath with chest pain.  You notice sudden or extreme swelling of your face, hands, ankles, feet, or legs.  You have not felt your baby move in over an hour.  You have severe headaches that do not go away when you take medicine.  You have vision changes. Summary  The second trimester is from week  14 through week 27 (months 4 through 6). It is also a time when the fetus is growing rapidly.  Your body goes through many changes during pregnancy. The changes vary from woman to woman.  Avoid  all smoking, herbs, alcohol, and unprescribed drugs. These chemicals affect the formation and growth your baby.  Do not use any tobacco products, such as cigarettes, chewing tobacco, and e-cigarettes. If you need help quitting, ask your health care provider.  Contact your health care provider if you have any questions. Keep all prenatal visits as told by your health care provider. This is important. This information is not intended to replace advice given to you by your health care provider. Make sure you discuss any questions you have with your health care provider. Document Revised: 05/12/2018 Document Reviewed: 02/24/2016 Elsevier Patient Education  2020 [employer].

## 2019-06-27 NOTE — Progress Notes (Signed)
INITIAL OBSTETRICAL VISIT Patient name: Julie Ellison MRN [id number]  Date of birth: 28-Sep-1995 Chief Complaint:   Initial Prenatal Visit  History of Present Illness:   Julie Ellison is a 24 y.o. G2P0010 Hispanic female at [redacted]w[redacted]d by LMP with an Estimated Date of Delivery: 12/13/19 being seen today for her initial obstetrical visit.  Her obstetrical history is significant for H/O RT ectopic pregnancy (surgically removed 11/03/2017). This is a planned pregnancy. She and the father of the baby (FOB) "Taurino" live together. She has a support system that consists of spouse/her mother/father/friends. Today she reports no complaints.   Patient's last menstrual period was 03/08/2019 (approximate). Last pap (?) 2019 per pt.  Review of Systems:   Pertinent items are noted in HPI Denies cramping/contractions, leakage of fluid, vaginal bleeding, abnormal vaginal discharge w/ itching/odor/irritation, headaches, visual changes, shortness of breath, chest pain, abdominal pain, severe nausea/vomiting, or problems with urination or bowel movements unless otherwise stated above.  Pertinent History Reviewed:  Reviewed past medical,surgical, social, obstetrical and family history.  Reviewed problem list, medications and allergies. OB History  Gravida Para Term Preterm AB Living  2       1 0  SAB TAB Ectopic Multiple Live Births      1   0    # Outcome Date GA Lbr Len/2nd Weight Sex Delivery Anes PTL Lv  2 Current           1 Ectopic            Physical Assessment:   Vitals:   06/27/19 1019  BP: 118/73  Pulse: (!) 118  Temp: 98.7 F (37.1 C)  Weight: 161 lb 9.6 oz (73.3 kg)  Body mass index is 28.63 kg/m.       Physical Examination:  General appearance - well appearing, and in no distress  Mental status - alert, oriented to person, place, and time  Psych:  She has a normal mood and affect  Skin - warm and dry, normal color, no suspicious lesions noted  Chest - effort  normal, all lung fields clear to auscultation bilaterally  Heart - normal rate and regular rhythm  Abdomen - soft, nontender  Extremities:  No swelling or varicosities noted  Pelvic - VULVA: normal appearing vulva with no masses, tenderness or lesions  VAGINA: normal appearing vagina with normal color and discharge, no lesions.   CERVIX: normal appearing cervix without discharge or lesions, no CMT  Thin prep pap is not done due to [employer] status   No results found for this or any previous visit (from the past 24 hour(s)).  Assessment & Plan:  1) Low-Risk Pregnancy G2P0010 at 106w6d with an Estimated Date of Delivery: 12/13/19   2) Initial OB visit - Welcomed to practice and introduced self to patient in addition to discussing other advanced practice providers that she may be seeing at this practice - Congratulated patient - Anticipatory guidance on upcoming appointments - Educated on COVID19 and pregnancy and the integration of virtual appointments  - Educated on babyscripts app- patient reports she has not received email, encouraged to look in spam folder and to call office if she still has not received email - patient verbalizes understanding   3) Supervision of other normal pregnancy, antepartum  - US OB Comp + 14 Wk,  - Cervicovaginal ancillary only( [place]),    Meds: No orders of the defined types were placed in this encounter.   Initial labs obtained Continue prenatal vitamins Reviewed  n/v relief measures and warning s/s to report Reviewed recommended weight gain based on pre-gravid BMI Encouraged well-balanced diet Genetic Screening discussed: results reviewed Cystic fibrosis, SMA, Fragile X screening discussed results reviewed The nature of [facility] with multiple MDs and other Advanced Practice Providers was explained to patient; also emphasized that residents, students are part of our team.  Discussed optimized OB schedule and  video visits. Advised can have an in-office visit whenever she feels she needs to be seen.  Does not have own BP cuff. BP cuff Rx faxed today. Explained to patient that BP will be mailed to her house. Check BP weekly, let us know if >140/90. Advised to call during normal business hours and there is an after-hours nurse line available.     Follow-up: Return in about 5 weeks (around 08/01/2019) for Return OB - My Chart video.   Orders Placed This Encounter  Procedures  . US OB Comp + 14 Wk    Amberly Livas MSN, [place] 06/27/2019

## 2019-06-28 LAB — CERVICOVAGINAL ANCILLARY ONLY
Bacterial Vaginitis (gardnerella): NEGATIVE
Candida Glabrata: NEGATIVE
Candida Vaginitis: NEGATIVE
Chlamydia: NEGATIVE
Comment: NEGATIVE
Comment: NEGATIVE
Comment: NEGATIVE
Comment: NEGATIVE
Comment: NEGATIVE
Comment: NORMAL
Neisseria Gonorrhea: NEGATIVE
Trichomonas: NEGATIVE

## 2019-07-31 ENCOUNTER — Encounter: Payer: Self-pay | Admitting: General Practice

## 2019-08-02 ENCOUNTER — Encounter: Payer: Self-pay | Admitting: Obstetrics and Gynecology

## 2019-08-02 ENCOUNTER — Telehealth (INDEPENDENT_AMBULATORY_CARE_PROVIDER_SITE_OTHER): Payer: Self-pay | Admitting: Obstetrics and Gynecology

## 2019-08-02 DIAGNOSIS — Z3480 Encounter for supervision of other normal pregnancy, unspecified trimester: Secondary | ICD-10-CM

## 2019-08-02 DIAGNOSIS — Z3A21 21 weeks gestation of pregnancy: Secondary | ICD-10-CM

## 2019-08-02 DIAGNOSIS — Z3482 Encounter for supervision of other normal pregnancy, second trimester: Secondary | ICD-10-CM

## 2019-08-02 NOTE — Progress Notes (Signed)
° °  MY CHART VIDEO VIRTUAL OBSTETRICS VISIT ENCOUNTER NOTE  I connected with Julie Ellison on 08/02/19 at  2:10 PM EDT by My Chart video at home and verified that I am speaking with the correct person using two identifiers. Provider located at [facility] [employer] for [facility] at [place].   I discussed the limitations, risks, security and privacy concerns of performing an evaluation and management service by My Chart video and the availability of in person appointments. I also discussed with the patient that there may be a patient responsible charge related to this service. The patient expressed understanding and agreed to proceed.  Subjective:  Julie Ellison is a 24 y.o. G2P0010 at [redacted]w[redacted]d being followed for ongoing prenatal care.  She is currently monitored for the following issues for this low-risk pregnancy and has Right tubal pregnancy without intrauterine pregnancy; Vaginal bleeding in pregnancy, first trimester; and Supervision of other normal pregnancy, antepartum on their problem list.  Patient reports husband's job will have him working in [place] with a possible move timeframe being the last week in August. She states she is not sure if they will drive the 19 hours or fly. She wants to know if she would be able to fly then and what she would need to do in order to transfer care. Reports fetal movement. Denies any contractions, bleeding or leaking of fluid.   The following portions of the patient's history were reviewed and updated as appropriate: allergies, current medications, past family history, past medical history, past social history, past surgical history and problem list.   Objective:   General:  Alert, oriented and cooperative.   Mental Status: Normal mood and affect perceived. Normal judgment and thought content.  Rest of physical exam deferred due to type of encounter  Vitals:   08/02/19 1403 08/02/19 1410  BP: (!) 145/82 121/77  Pulse: 96 99      **Done by patient's own at home BP cuff and scale  Assessment and Plan:  Pregnancy: G2P0010 at [redacted]w[redacted]d  1. Supervision of other normal pregnancy, antepartum - Discussed transfer of records process. - Advised to fly instead of riding in a care for >19 hrs d/t need to make frequent stops to avoid DVT  - Next visit via My Chart  - Anticipatory guidance for 2 hr GTT at 28 wks - Information provided on GTT  -Repeat U/S for incomplete U/S (for spinal views)  Preterm labor symptoms and general obstetric precautions including but not limited to vaginal bleeding, contractions, leaking of fluid and fetal movement were reviewed in detail with the patient.  I discussed the assessment and treatment plan with the patient. The patient was provided an opportunity to ask questions and all were answered. The patient agreed with the plan and demonstrated an understanding of the instructions. The patient was advised to call back or seek an in-person office evaluation/go to MAU at [facility] for any urgent or concerning symptoms. Please refer to After Visit Summary for other counseling recommendations.   I provided 10 minutes of non-face-to-face time during this encounter. There was 5 minutes of chart review time spent prior to this encounter. Total time spent = 15 minutes.  Return in about 4 weeks (around 08/30/2019) for Return OB - My Chart video.  No future appointments.  Raelyn Mora, CNM [facility], [facility]

## 2019-08-02 NOTE — Patient Instructions (Signed)
Glucose Tolerance Test During Pregnancy Why am I having this test? The glucose tolerance test (GTT) is done to check how your body processes sugar (glucose). This is one of several tests used to diagnose diabetes that develops during pregnancy (gestational diabetes mellitus). Gestational diabetes is a temporary form of diabetes that some women develop during pregnancy. It usually occurs during the second trimester of pregnancy and goes away after delivery. Testing (screening) for gestational diabetes usually occurs between 24 and 28 weeks of pregnancy. You may have the GTT test after having a 1-hour glucose screening test if the results from that test indicate that you may have gestational diabetes. You may also have this test if:  You have a history of gestational diabetes.  You have a history of giving birth to very large babies or have experienced repeated fetal loss (stillbirth).  You have signs and symptoms of diabetes, such as: ? Changes in your vision. ? Tingling or numbness in your hands or feet. ? Changes in hunger, thirst, and urination that are not otherwise explained by your pregnancy. What is being tested? This test measures the amount of glucose in your blood at different times during a period of 3 hours. This indicates how well your body is able to process glucose. What kind of sample is taken?  Blood samples are required for this test. They are usually collected by inserting a needle into a blood vessel. How do I prepare for this test?  For 3 days before your test, eat normally. Have plenty of carbohydrate-rich foods.  Follow instructions from your health care provider about: ? Eating or drinking restrictions on the day of the test. You may be asked to not eat or drink anything other than water (fast) starting 8-10 hours before the test. ? Changing or stopping your regular medicines. Some medicines may interfere with this test. Tell a health care provider about:  All  medicines you are taking, including vitamins, herbs, eye drops, creams, and over-the-counter medicines.  Any blood disorders you have.  Any surgeries you have had.  Any medical conditions you have. What happens during the test? First, your blood glucose will be measured. This is referred to as your fasting blood glucose, since you fasted before the test. Then, you will drink a glucose solution that contains a certain amount of glucose. Your blood glucose will be measured again 1, 2, and 3 hours after drinking the solution. This test takes about 3 hours to complete. You will need to stay at the testing location during this time. During the testing period:  Do not eat or drink anything other than the glucose solution.  Do not exercise.  Do not use any products that contain nicotine or tobacco, such as cigarettes and e-cigarettes. If you need help stopping, ask your health care provider. The testing procedure may vary among health care providers and hospitals. How are the results reported? Your results will be reported as milligrams of glucose per deciliter of blood (mg/dL) or millimoles per liter (mmol/L). Your health care provider will compare your results to normal ranges that were established after testing a large group of people (reference ranges). Reference ranges may vary among labs and hospitals. For this test, common reference ranges are:  Fasting: less than 95-105 mg/dL (5.3-5.8 mmol/L).  1 hour after drinking glucose: less than 180-190 mg/dL (10.0-10.5 mmol/L).  2 hours after drinking glucose: less than 155-165 mg/dL (8.6-9.2 mmol/L).  3 hours after drinking glucose: 140-145 mg/dL (7.8-8.1 mmol/L). What do the   results mean? Results within reference ranges are considered normal, meaning that your glucose levels are well-controlled. If two or more of your blood glucose levels are high, you may be diagnosed with gestational diabetes. If only one level is high, your health care  provider may suggest repeat testing or other tests to confirm a diagnosis. Talk with your health care provider about what your results mean. Questions to ask your health care provider Ask your health care provider, or the department that is doing the test:  When will my results be ready?  How will I get my results?  What are my treatment options?  What other tests do I need?  What are my next steps? Summary  The glucose tolerance test (GTT) is one of several tests used to diagnose diabetes that develops during pregnancy (gestational diabetes mellitus). Gestational diabetes is a temporary form of diabetes that some women develop during pregnancy.  You may have the GTT test after having a 1-hour glucose screening test if the results from that test indicate that you may have gestational diabetes. You may also have this test if you have any symptoms or risk factors for gestational diabetes.  Talk with your health care provider about what your results mean. This information is not intended to replace advice given to you by your health care provider. Make sure you discuss any questions you have with your health care provider. Document Revised: 05/11/2018 Document Reviewed: 08/30/2016 Elsevier Patient Education  2020 Elsevier Inc.  

## 2019-08-03 ENCOUNTER — Telehealth: Payer: Self-pay | Admitting: General Practice

## 2019-08-03 NOTE — Telephone Encounter (Signed)
Left message on VM in regard to follow up US on 08/20/2019 at 12:30pm at [facility].  Asked pt to give our office a call with any questions or concerns.  Mychart message sent to pt as well.

## 2019-08-24 ENCOUNTER — Encounter: Payer: Self-pay | Admitting: General Practice

## 2019-08-30 ENCOUNTER — Telehealth (INDEPENDENT_AMBULATORY_CARE_PROVIDER_SITE_OTHER): Payer: Self-pay | Admitting: Obstetrics and Gynecology

## 2019-08-30 DIAGNOSIS — Z3482 Encounter for supervision of other normal pregnancy, second trimester: Secondary | ICD-10-CM

## 2019-08-30 DIAGNOSIS — Z3A25 25 weeks gestation of pregnancy: Secondary | ICD-10-CM

## 2019-08-30 DIAGNOSIS — Z3480 Encounter for supervision of other normal pregnancy, unspecified trimester: Secondary | ICD-10-CM

## 2019-09-02 ENCOUNTER — Encounter: Payer: Self-pay | Admitting: Obstetrics and Gynecology

## 2019-09-02 NOTE — Progress Notes (Signed)
   MY CHART VIDEO VIRTUAL OBSTETRICS VISIT ENCOUNTER NOTE  I connected with Julie Ellison on 09/02/19 at  4:10 PM EDT by My Chart video at home and verified that I am speaking with the correct person using two identifiers. Provider located at [employer] for [employer] at [place].   I discussed the limitations, risks, security and privacy concerns of performing an evaluation and management service by My Chart video and the availability of in person appointments. I also discussed with the patient that there may be a patient responsible charge related to this service. The patient expressed understanding and agreed to proceed.  Subjective:  Julie Ellison is a 24 y.o. G2P0010 at [redacted]w[redacted]d being followed for ongoing prenatal care.  She is currently monitored for the following issues for this low-risk pregnancy and has Right tubal pregnancy without intrauterine pregnancy; Vaginal bleeding in pregnancy, first trimester; and Supervision of other normal pregnancy, antepartum on their problem list.  Patient reports no complaints. Reports fetal movement. Denies any contractions, bleeding or leaking of fluid.   The following portions of the patient's history were reviewed and updated as appropriate: allergies, current medications, past family history, past medical history, past social history, past surgical history and problem list.   Objective:   General:  Alert, oriented and cooperative.   Mental Status: Normal mood and affect perceived. Normal judgment and thought content.  Rest of physical exam deferred due to type of encounter  BP 117/75   Pulse (!) 112   Wt 172 lb 9.6 oz (78.3 kg)   LMP 03/08/2019 (Approximate)   BMI 30.57 kg/m  **Done by patient's own at home BP cuff and scale  Assessment and Plan:  Pregnancy: G2P0010 at [redacted]w[redacted]d  1. Supervision of other normal pregnancy, antepartum - Anticipatory guidance for 2 hr GTT and 3rd trimester labs in 3 wks  Preterm labor  symptoms and general obstetric precautions including but not limited to vaginal bleeding, contractions, leaking of fluid and fetal movement were reviewed in detail with the patient.  I discussed the assessment and treatment plan with the patient. The patient was provided an opportunity to ask questions and all were answered. The patient agreed with the plan and demonstrated an understanding of the instructions. The patient was advised to call back or seek an in-person office evaluation/go to MAU at [facility] for any urgent or concerning symptoms. Please refer to After Visit Summary for other counseling recommendations.   I provided 5 minutes of non-face-to-face time during this encounter. There was 5 minutes of chart review time spent prior to this encounter. Total time spent = 10 minutes.  Return in about 3 weeks (around 09/20/2019) for Return OB 2hr GTT.  Future Appointments  Date Time Provider Department Center  09/21/2019  8:10 AM Sharyon Cable, CNM [facility] None    Raelyn Mora, CNM [facility], [facility]

## 2019-09-21 ENCOUNTER — Encounter: Payer: Self-pay | Admitting: Certified Nurse Midwife

## 2019-09-21 ENCOUNTER — Other Ambulatory Visit: Payer: Self-pay

## 2019-09-21 ENCOUNTER — Ambulatory Visit (INDEPENDENT_AMBULATORY_CARE_PROVIDER_SITE_OTHER): Payer: Self-pay | Admitting: Certified Nurse Midwife

## 2019-09-21 VITALS — BP 117/75 | HR 120 | Temp 97.9°F | Wt 174.0 lb

## 2019-09-21 DIAGNOSIS — Z23 Encounter for immunization: Secondary | ICD-10-CM

## 2019-09-21 DIAGNOSIS — O24419 Gestational diabetes mellitus in pregnancy, unspecified control: Secondary | ICD-10-CM

## 2019-09-21 DIAGNOSIS — Z3A28 28 weeks gestation of pregnancy: Secondary | ICD-10-CM

## 2019-09-21 DIAGNOSIS — Z3480 Encounter for supervision of other normal pregnancy, unspecified trimester: Secondary | ICD-10-CM

## 2019-09-21 MED ORDER — TETANUS-DIPHTH-ACELL PERTUSSIS 5-2.5-18.5 LF-MCG/0.5 IM SUSP: 0.5000 mL | Freq: Once | INTRAMUSCULAR | 0 refills | Status: AC

## 2019-09-21 NOTE — Progress Notes (Signed)
   PRENATAL VISIT NOTE  Subjective:  Julie Ellison is a 24 y.o. G2P0010 at [redacted]w[redacted]d being seen today for ongoing prenatal care.  She is currently monitored for the following issues for this low-risk pregnancy and has Right tubal pregnancy without intrauterine pregnancy; Vaginal bleeding in pregnancy, first trimester; and Supervision of other normal pregnancy, antepartum on their problem list.  Patient reports no complaints.  Contractions: Not present. Vag. Bleeding: None.  Movement: Present. Denies leaking of fluid.   The following portions of the patient's history were reviewed and updated as appropriate: allergies, current medications, past family history, past medical history, past social history, past surgical history and problem list.   Objective:   Vitals:   09/21/19 0804  BP: 117/75  Pulse: (!) 120  Temp: 97.9 F (36.6 C)  Weight: 174 lb (78.9 kg)    Fetal Status: Fetal Heart Rate (bpm): 154 Fundal Height: 29 cm Movement: Present     General:  Alert, oriented and cooperative. Patient is in no acute distress.  Skin: Skin is warm and dry. No rash noted.   Cardiovascular: Normal heart rate noted  Respiratory: Normal respiratory effort, no problems with respiration noted  Abdomen: Soft, gravid, appropriate for gestational age.  Pain/Pressure: Absent     Pelvic: Cervical exam deferred        Extremities: Normal range of motion.  Edema: None  Mental Status: Normal mood and affect. Normal behavior. Normal judgment and thought content.   Assessment and Plan:  Pregnancy: G2P0010 at [redacted]w[redacted]d 1. Supervision of other normal pregnancy, antepartum - Patient doing well, no complaints or concerns at this time  - Patient plans to move to [place] in 9 days, patient has contacted clinic in [facility] but has not made appointment yet. - Patient release of records form completed today so that patient can initiate care as soon as she gets there.  - Routine prenatal care  - Glucose  Tolerance, 2 Hours w/1 Hour - HIV Antibody (routine testing w rflx) - RPR - CBC - Tdap (BOOSTRIX) 5-2.5-18.5 LF-MCG/0.5 injection; Inject 0.5 mLs into the muscle once for 1 dose.  Dispense: 0.5 mL; Refill: 0  2. Need for tetanus, diphtheria, and acellular pertussis (Tdap) vaccine in patient of adolescent age or older - Tdap (BOOSTRIX) 5-2.5-18.5 LF-MCG/0.5 injection; Inject 0.5 mLs into the muscle once for 1 dose.  Dispense: 0.5 mL; Refill: 0  3. [redacted] weeks gestation of pregnancy   Preterm labor symptoms and general obstetric precautions including but not limited to vaginal bleeding, contractions, leaking of fluid and fetal movement were reviewed in detail with the patient. Please refer to After Visit Summary for other counseling recommendations.    Sharyon Cable, CNM

## 2019-09-21 NOTE — Patient Instructions (Signed)
Glucose Tolerance Test During Pregnancy Why am I having this test? The glucose tolerance test (GTT) is done to check how your body processes sugar (glucose). This is one of several tests used to diagnose diabetes that develops during pregnancy (gestational diabetes mellitus). Gestational diabetes is a temporary form of diabetes that some women develop during pregnancy. It usually occurs during the second trimester of pregnancy and goes away after delivery. Testing (screening) for gestational diabetes usually occurs between 24 and 28 weeks of pregnancy. You may have the GTT test after having a 1-hour glucose screening test if the results from that test indicate that you may have gestational diabetes. You may also have this test if:  You have a history of gestational diabetes.  You have a history of giving birth to very large babies or have experienced repeated fetal loss (stillbirth).  You have signs and symptoms of diabetes, such as: ? Changes in your vision. ? Tingling or numbness in your hands or feet. ? Changes in hunger, thirst, and urination that are not otherwise explained by your pregnancy. What is being tested? This test measures the amount of glucose in your blood at different times during a period of 3 hours. This indicates how well your body is able to process glucose. What kind of sample is taken?  Blood samples are required for this test. They are usually collected by inserting a needle into a blood vessel. How do I prepare for this test?  For 3 days before your test, eat normally. Have plenty of carbohydrate-rich foods.  Follow instructions from your health care provider about: ? Eating or drinking restrictions on the day of the test. You may be asked to not eat or drink anything other than water (fast) starting 8-10 hours before the test. ? Changing or stopping your regular medicines. Some medicines may interfere with this test. Tell a health care provider about:  All  medicines you are taking, including vitamins, herbs, eye drops, creams, and over-the-counter medicines.  Any blood disorders you have.  Any surgeries you have had.  Any medical conditions you have. What happens during the test? First, your blood glucose will be measured. This is referred to as your fasting blood glucose, since you fasted before the test. Then, you will drink a glucose solution that contains a certain amount of glucose. Your blood glucose will be measured again 1, 2, and 3 hours after drinking the solution. This test takes about 3 hours to complete. You will need to stay at the testing location during this time. During the testing period:  Do not eat or drink anything other than the glucose solution.  Do not exercise.  Do not use any products that contain nicotine or tobacco, such as cigarettes and e-cigarettes. If you need help stopping, ask your health care provider. The testing procedure may vary among health care providers and hospitals. How are the results reported? Your results will be reported as milligrams of glucose per deciliter of blood (mg/dL) or millimoles per liter (mmol/L). Your health care provider will compare your results to normal ranges that were established after testing a large group of people (reference ranges). Reference ranges may vary among labs and hospitals. For this test, common reference ranges are:  Fasting: less than 95-105 mg/dL (5.3-5.8 mmol/L).  1 hour after drinking glucose: less than 180-190 mg/dL (10.0-10.5 mmol/L).  2 hours after drinking glucose: less than 155-165 mg/dL (8.6-9.2 mmol/L).  3 hours after drinking glucose: 140-145 mg/dL (7.8-8.1 mmol/L). What do the   results mean? Results within reference ranges are considered normal, meaning that your glucose levels are well-controlled. If two or more of your blood glucose levels are high, you may be diagnosed with gestational diabetes. If only one level is high, your health care  provider may suggest repeat testing or other tests to confirm a diagnosis. Talk with your health care provider about what your results mean. Questions to ask your health care provider Ask your health care provider, or the department that is doing the test:  When will my results be ready?  How will I get my results?  What are my treatment options?  What other tests do I need?  What are my next steps? Summary  The glucose tolerance test (GTT) is one of several tests used to diagnose diabetes that develops during pregnancy (gestational diabetes mellitus). Gestational diabetes is a temporary form of diabetes that some women develop during pregnancy.  You may have the GTT test after having a 1-hour glucose screening test if the results from that test indicate that you may have gestational diabetes. You may also have this test if you have any symptoms or risk factors for gestational diabetes.  Talk with your health care provider about what your results mean. This information is not intended to replace advice given to you by your health care provider. Make sure you discuss any questions you have with your health care provider. Document Revised: 05/11/2018 Document Reviewed: 08/30/2016 Elsevier Patient Education  2020 Elsevier Inc.  

## 2019-09-22 LAB — RPR: RPR Ser Ql: NONREACTIVE

## 2019-09-22 LAB — GLUCOSE TOLERANCE, 2 HOURS W/ 1HR
Glucose, 1 hour: 139 mg/dL (ref 65–179)
Glucose, 2 hour: 119 mg/dL (ref 65–152)
Glucose, Fasting: 112 mg/dL — ABNORMAL HIGH (ref 65–91)

## 2019-09-22 LAB — CBC
Hematocrit: 41.6 % (ref 34.0–46.6)
Hemoglobin: 13.8 g/dL (ref 11.1–15.9)
MCH: 29.8 pg (ref 26.6–33.0)
MCHC: 33.2 g/dL (ref 31.5–35.7)
MCV: 90 fL (ref 79–97)
Platelets: 299 10*3/uL (ref 150–450)
RBC: 4.63 x10E6/uL (ref 3.77–5.28)
RDW: 11.9 % (ref 11.7–15.4)
WBC: 12.3 10*3/uL — ABNORMAL HIGH (ref 3.4–10.8)

## 2019-09-22 LAB — HIV ANTIBODY (ROUTINE TESTING W REFLEX): HIV Screen 4th Generation wRfx: NONREACTIVE

## 2019-09-24 ENCOUNTER — Encounter: Payer: Self-pay | Admitting: Certified Nurse Midwife

## 2019-09-24 DIAGNOSIS — O09299 Supervision of pregnancy with other poor reproductive or obstetric history, unspecified trimester: Secondary | ICD-10-CM

## 2019-09-24 DIAGNOSIS — O24419 Gestational diabetes mellitus in pregnancy, unspecified control: Secondary | ICD-10-CM | POA: Insufficient documentation

## 2019-09-24 HISTORY — DX: Supervision of pregnancy with other poor reproductive or obstetric history, unspecified trimester: O09.299

## 2019-09-24 MED ORDER — ACCU-CHEK SOFTCLIX LANCETS MISC: 1.0000 | Freq: Four times a day (QID) | 12 refills | Status: DC

## 2019-09-24 MED ORDER — ACCU-CHEK GUIDE VI STRP: ORAL_STRIP | 12 refills | Status: DC

## 2019-09-24 MED ORDER — ACCU-CHEK GUIDE W/DEVICE KIT: 1.0000 | PACK | Freq: Four times a day (QID) | 0 refills | Status: DC

## 2019-09-24 NOTE — Addendum Note (Signed)
Addended by: Sharyon Cable on: 09/24/2019 09:02 AM   Modules accepted: Orders

## 2019-11-07 ENCOUNTER — Telehealth: Payer: Self-pay | Admitting: General Practice

## 2019-11-07 NOTE — Telephone Encounter (Signed)
Patient moved to [place].

## 2019-11-27 DIAGNOSIS — O24419 Gestational diabetes mellitus in pregnancy, unspecified control: Secondary | ICD-10-CM

## 2019-11-27 DIAGNOSIS — O1490 Unspecified pre-eclampsia, unspecified trimester: Secondary | ICD-10-CM

## 2020-04-06 IMAGING — US US OB < 14 WEEKS - US OB TV
1 series · 15 of 28 positions shown · non-contrast
Comparison: None.

CLINICAL DATA: Vaginal spotting

EXAM:
OBSTETRIC <14 WK US AND TRANSVAGINAL OB US
TECHNIQUE: Both transabdominal and transvaginal ultrasound examinations were
performed for complete evaluation of the gestation as well as the
maternal uterus, adnexal regions, and pelvic cul-de-sac.
Transvaginal technique was performed to assess early pregnancy.

[Series 1: us ob < 14 weeks - us ob tv · 70 acquisitions, 15 frames shown]
[im 1/70]
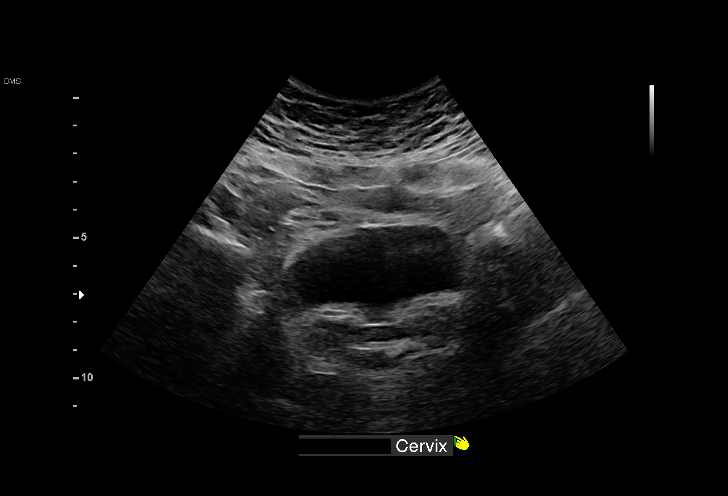
[im 6/70]
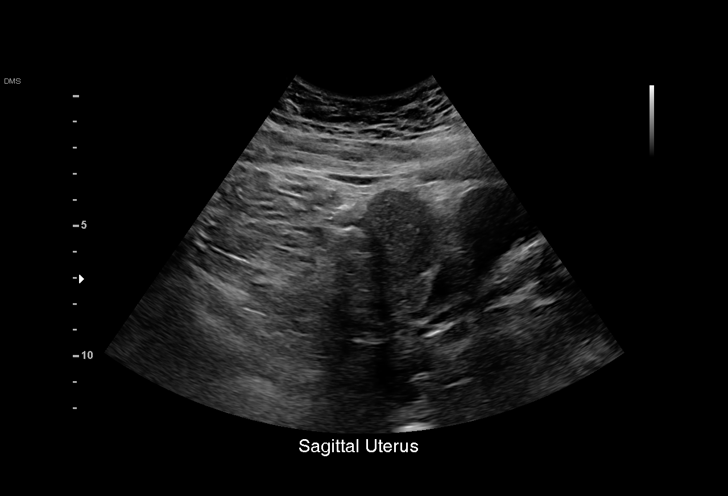
[im 11/70]
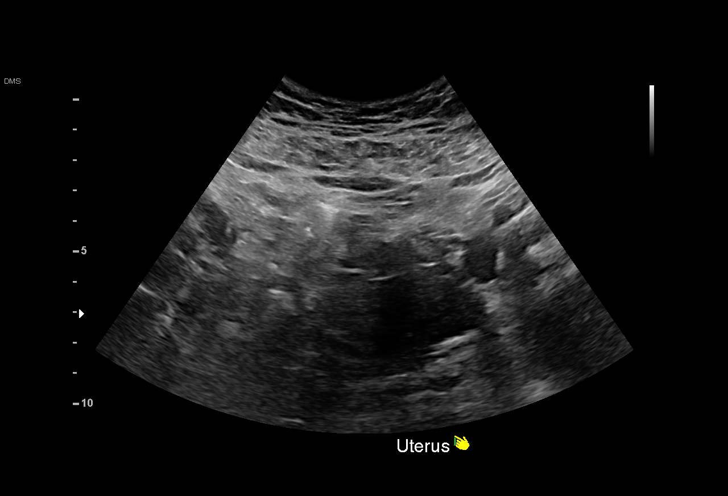
[im 16/70]
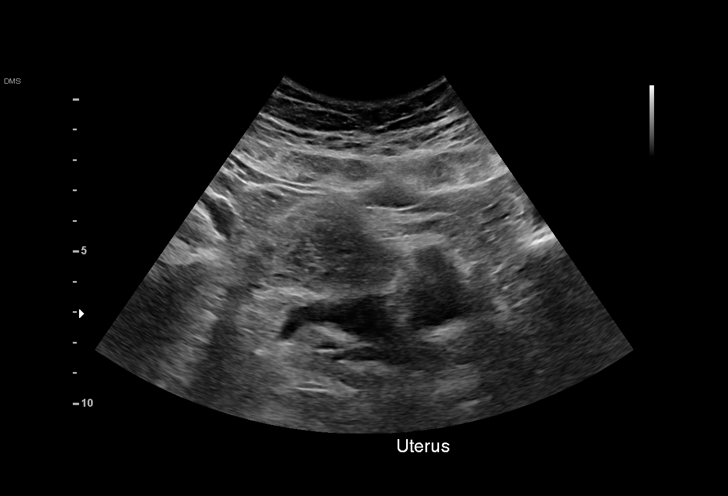
[im 21/70]
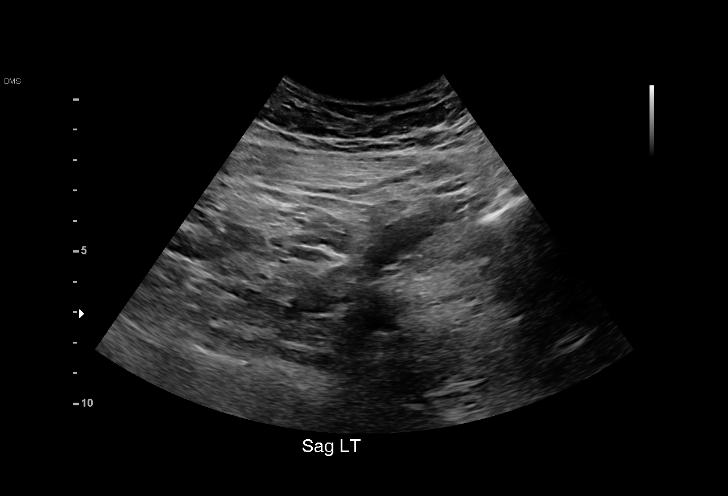
[im 26/70]
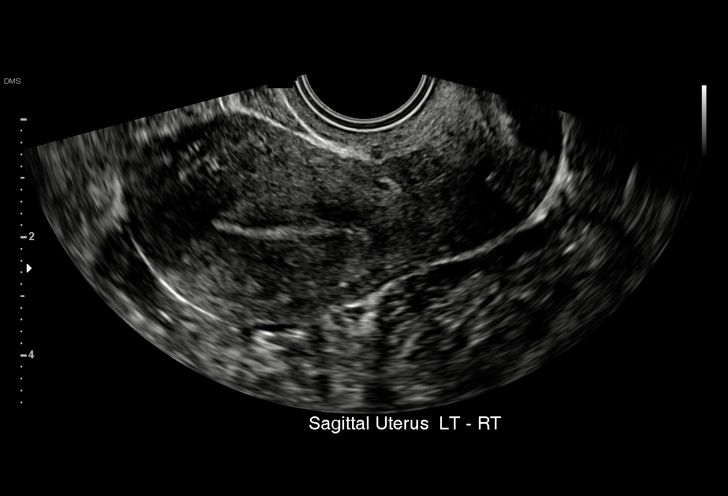
[im 31/70]
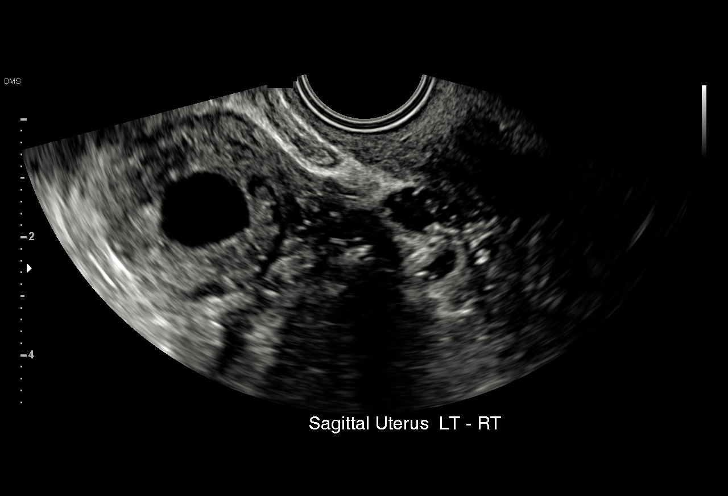
[im 36/70]
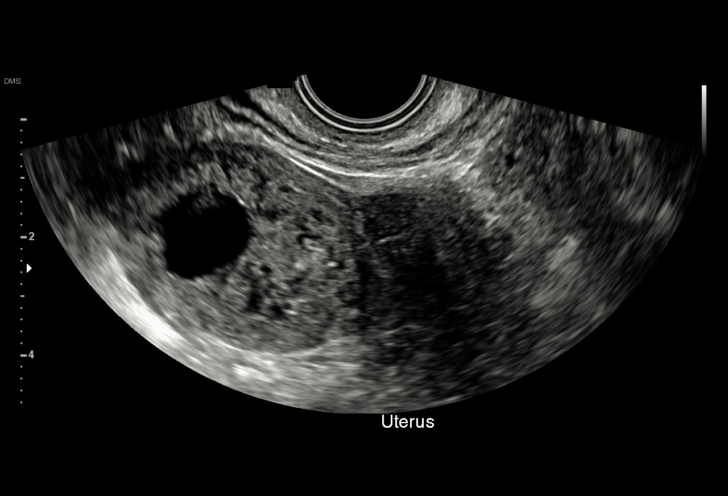
[im 39/70]
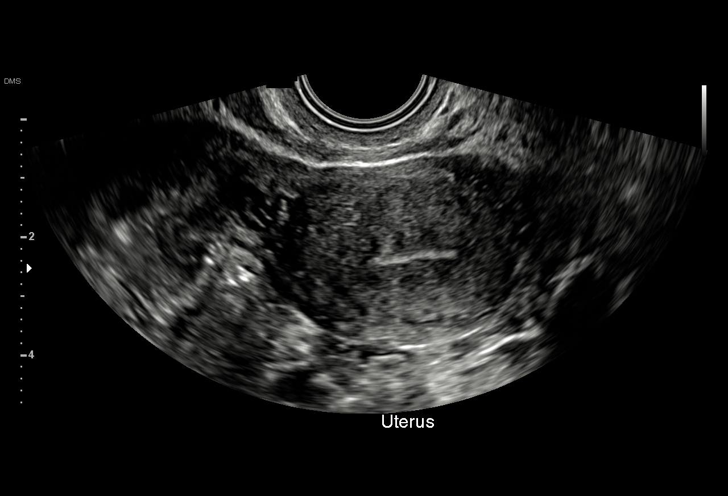
[im 44/70]
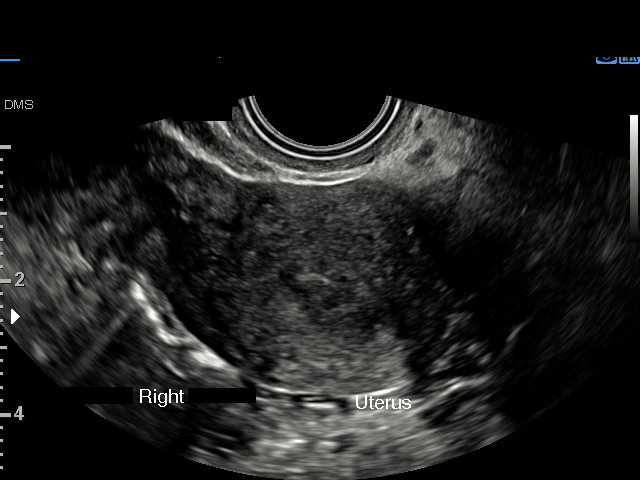
[im 49/70]
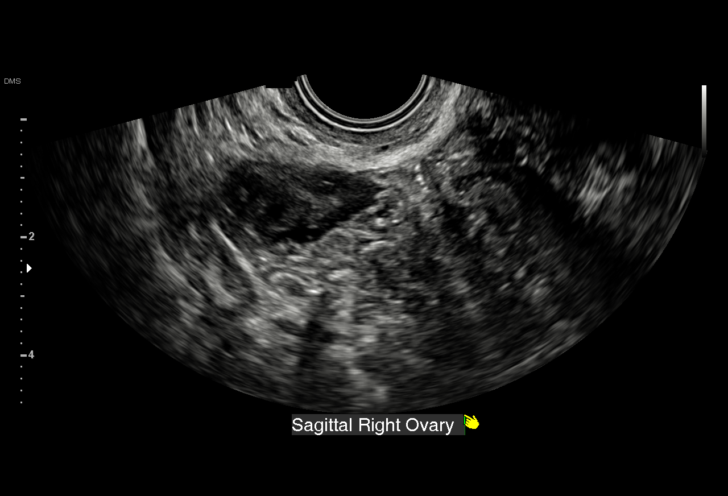
[im 54/70]
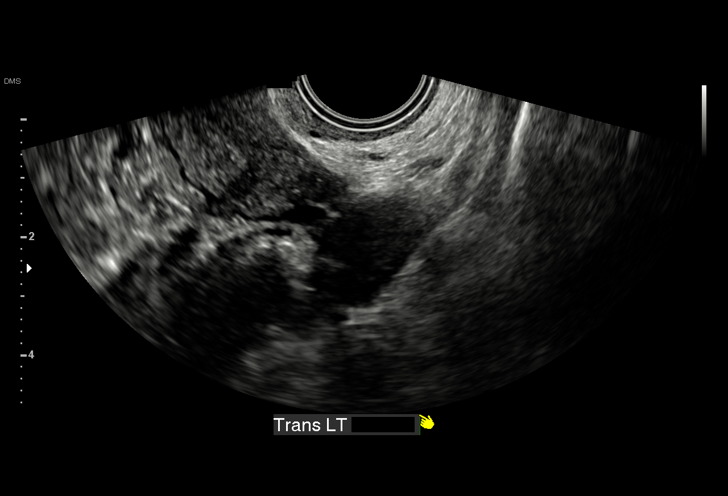
[im 59/70]
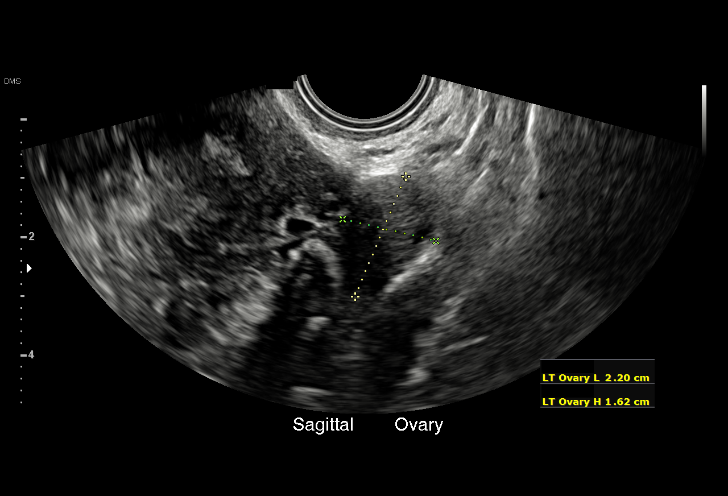
[im 64/70]
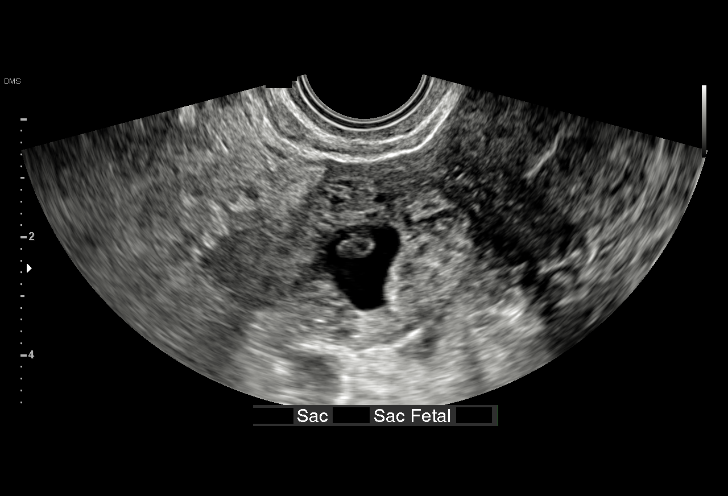
[im 70/70]
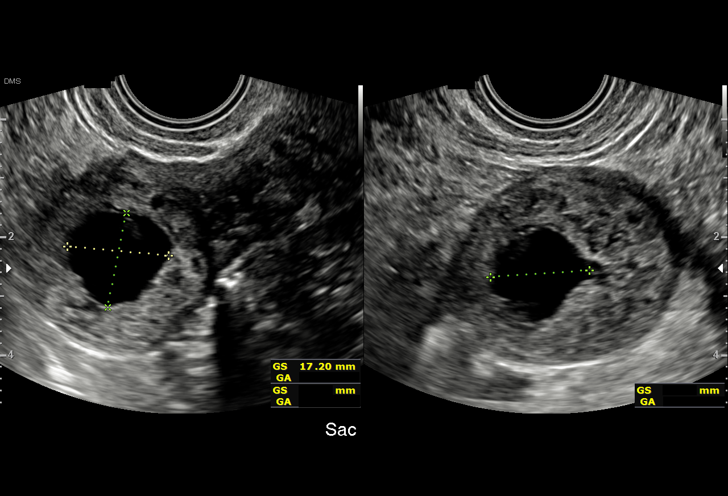

[15 of 28 positions shown; findings below may reference images not displayed]

FINDINGS: Intrauterine gestational sac: Not visualized

Yolk sac:  Visualized in ectopic location

Embryo:  Visualized in ectopic location

Cardiac Activity: Not visualized

CRL:  8 mm   6 w   4 d

Subchorionic hemorrhage:  None visualized.

Maternal uterus/adnexae: There is a fetal pole and yolk sac within
the gestational sac located in the right fallopian tube adjacent to
but separate from the uterus. No fetal heart activity is evident.
There is no intrauterine mass or gestation. The left ovary appears
normal. There is a small amount of free pelvic fluid.
IMPRESSION: Ectopic gestation in the right fallopian tube adjacent to the
uterus. No intrauterine gestation seen. No fetal heart activity
identified within the ectopic gestation. Small amount of free fluid
noted.

Critical Value/emergent results were called by telephone at the time
verbally acknowledged these results.

## 2021-07-27 ENCOUNTER — Encounter (HOSPITAL_COMMUNITY): Payer: Self-pay | Admitting: Family Medicine

## 2021-07-27 ENCOUNTER — Inpatient Hospital Stay (HOSPITAL_COMMUNITY)
Admission: AD | Admit: 2021-07-27 | Discharge: 2021-07-27 | Disposition: A | Discharge status: Home / Self Care | Payer: Self-pay | Attending: Family Medicine | Admitting: Family Medicine

## 2021-07-27 ENCOUNTER — Ambulatory Visit
Admission: EM | Admit: 2021-07-27 | Discharge: 2021-07-27 | Disposition: A | Discharge status: Home / Self Care | Payer: Self-pay | Attending: Internal Medicine | Admitting: Internal Medicine

## 2021-07-27 DIAGNOSIS — O09291 Supervision of pregnancy with other poor reproductive or obstetric history, first trimester: Secondary | ICD-10-CM | POA: Insufficient documentation

## 2021-07-27 DIAGNOSIS — Z3A09 9 weeks gestation of pregnancy: Secondary | ICD-10-CM | POA: Insufficient documentation

## 2021-07-27 DIAGNOSIS — R42 Dizziness and giddiness: Secondary | ICD-10-CM | POA: Insufficient documentation

## 2021-07-27 DIAGNOSIS — O26891 Other specified pregnancy related conditions, first trimester: Secondary | ICD-10-CM | POA: Insufficient documentation

## 2021-07-27 DIAGNOSIS — Z3201 Encounter for pregnancy test, result positive: Secondary | ICD-10-CM

## 2021-07-27 DIAGNOSIS — R112 Nausea with vomiting, unspecified: Secondary | ICD-10-CM

## 2021-07-27 DIAGNOSIS — O219 Vomiting of pregnancy, unspecified: Secondary | ICD-10-CM | POA: Insufficient documentation

## 2021-07-27 LAB — COMPREHENSIVE METABOLIC PANEL
ALT: 17 U/L (ref 0–44)
AST: 14 U/L — ABNORMAL LOW (ref 15–41)
Albumin: 3.4 g/dL — ABNORMAL LOW (ref 3.5–5.0)
Alkaline Phosphatase: 61 U/L (ref 38–126)
Anion gap: 10 (ref 5–15)
BUN: 6 mg/dL (ref 6–20)
CO2: 22 mmol/L (ref 22–32)
Calcium: 8.6 mg/dL — ABNORMAL LOW (ref 8.9–10.3)
Chloride: 104 mmol/L (ref 98–111)
Creatinine, Ser: 0.52 mg/dL (ref 0.44–1.00)
GFR, Estimated: >60 mL/min (ref >60)
Glucose, Bld: 98 mg/dL (ref 70–99)
Potassium: 3.4 mmol/L — ABNORMAL LOW (ref 3.5–5.1)
Sodium: 136 mmol/L (ref 135–145)
Total Bilirubin: 0.4 mg/dL (ref 0.3–1.2)
Total Protein: 7 g/dL (ref 6.5–8.1)

## 2021-07-27 LAB — URINALYSIS, ROUTINE W REFLEX MICROSCOPIC
Bilirubin Urine: NEGATIVE
Glucose, UA: NEGATIVE mg/dL
Ketones, ur: 5 mg/dL — AB
Nitrite: NEGATIVE
Protein, ur: 100 mg/dL — AB
Specific Gravity, Urine: 1.031 — ABNORMAL HIGH (ref 1.005–1.030)
pH: 6 (ref 5.0–8.0)

## 2021-07-27 LAB — POCT URINE PREGNANCY: Preg Test, Ur: POSITIVE — AB

## 2021-07-27 MED ORDER — ONDANSETRON HCL 8 MG PO TABS: 8.0000 mg | ORAL_TABLET | Freq: Three times a day (TID) | ORAL | 1 refills | Status: DC | PRN

## 2021-07-27 MED ORDER — LACTATED RINGERS IV BOLUS
1000.0000 mL | Freq: Once | INTRAVENOUS | Status: AC
  Administered 2021-07-27: 1000 mL via INTRAVENOUS

## 2021-07-27 MED ORDER — SODIUM CHLORIDE 0.9 % IV SOLN
8.0000 mg | Freq: Once | INTRAVENOUS | Status: AC
  Administered 2021-07-27: 8 mg via INTRAVENOUS
  Filled 2021-07-27: qty 4

## 2021-07-27 MED ORDER — FAMOTIDINE 20 MG PO TABS: 20.0000 mg | ORAL_TABLET | Freq: Two times a day (BID) | ORAL | 1 refills | Status: DC

## 2021-07-27 MED ORDER — FAMOTIDINE IN NACL 20-0.9 MG/50ML-% IV SOLN
20.0000 mg | Freq: Once | INTRAVENOUS | Status: AC
  Administered 2021-07-27: 20 mg via INTRAVENOUS
  Filled 2021-07-27: qty 50

## 2021-07-27 NOTE — ED Notes (Signed)
 Patient is being discharged from the Urgent Care and sent to the Emergency Department via pov . Per mound, np, patient is in need of higher level of care due to difficulty vomiting while preg. Patient is aware and verbalizes understanding of plan of care.  Vitals:   07/27/21 1111  BP: 130/83  Pulse: 91  Resp: 17  Temp: 98.2 F (36.8 C)  SpO2: 98%

## 2021-07-27 NOTE — ED Provider Notes (Signed)
[facility]    CSN: [id number] Arrival date & time: 07/27/21  1049      History   Chief Complaint Chief Complaint  Patient presents with   Possible Pregnancy    Having a hard time throwing up bile 5x in one morning everyday for the past 2 weeks. - Entered by patient    HPI Julie Ellison is a 26 y.o. female.   Patient presents due to nausea and vomiting that is suspected to be related to early pregnancy symptoms.  Patient reports that she had a positive pregnancy test at home a few weeks prior.  It was confirmed at the health department.  She has another appointment in 10 weeks with health department for prenatal care.  Her last menstrual cycle was 05/24/2021.  Patient has also been having some breast tenderness.  She states that she has not been able to keep any food or fluids down in the past week and has been having associated headache.  She reports that she has also lost about 10 pounds in a week given limited oral intake. Denies any abnormal vaginal bleeding. She does have a history of ectopic pregnancy.  Denies any associated abdominal pain.   Possible Pregnancy    Past Medical History:  Diagnosis Date   Hx of gonorrhea 2017    Patient Active Problem List   Diagnosis Date Noted   Gestational diabetes mellitus (GDM) affecting pregnancy, antepartum 09/24/2019   Supervision of other normal pregnancy, antepartum 06/12/2019   Right tubal pregnancy without intrauterine pregnancy 11/03/2017   Vaginal bleeding in pregnancy, first trimester     Past Surgical History:  Procedure Laterality Date   DIAGNOSTIC LAPAROSCOPY WITH REMOVAL OF ECTOPIC PREGNANCY N/A 11/03/2017   Procedure: DIAGNOSTIC LAPAROSCOPY, Right Salpingectomy with removal ECTOPIC PREGNANCY;  Surgeon: Willodean Rosenthal, MD;  Location: [facility];  Service: Gynecology;  Laterality: N/A;   NO PAST SURGERIES      OB History     Gravida  3   Para      Term      Preterm      AB  1    Living  0      SAB      IAB      Ectopic  1   Multiple      Live Births  0            Home Medications    Prior to Admission medications   Medication Sig Start Date End Date Taking? Authorizing Provider  Accu-Chek Softclix Lancets lancets 1 each by Other route 4 (four) times daily. 09/24/19   Sharyon Cable, CNM  Blood Glucose Monitoring Suppl (ACCU-CHEK GUIDE) w/Device KIT 1 kit by Subdermal route 4 (four) times daily. Check blood sugars for fasting, and two hours after breakfast, lunch and dinner (4 checks daily) 09/24/19   Sharyon Cable, CNM  glucose blood (ACCU-CHEK GUIDE) test strip Use as instructed 09/24/19   Sharyon Cable, CNM  Prenatal Vit-Fe Fumarate-FA (PRENATAL VITAMIN PO) Take 1 tablet by mouth daily.    [provider]    Family History Family History  Family history unknown: Yes    Social History Social History   Tobacco Use   Smoking status: Former   Smokeless tobacco: Never  Building services engineer Use: Never used  Substance Use Topics   Alcohol use: Not Currently   Drug use: Never     Allergies   Patient has no known allergies.  Review of Systems Review of Systems Per HPI  Physical Exam Triage Vital Signs ED Triage Vitals  Enc Vitals Group     BP 07/27/21 1111 130/83     Pulse Rate 07/27/21 1111 91     Resp 07/27/21 1111 17     Temp 07/27/21 1111 98.2 F (36.8 C)     Temp Source 07/27/21 1111 Oral     SpO2 07/27/21 1111 98 %     Weight --      Height --      Head Circumference --      Peak Flow --      Pain Score 07/27/21 1109 0     Pain Loc --      Pain Edu? --      Excl. in GC? --    No data found.  Updated Vital Signs BP 130/83 (BP Location: Left Arm)   Pulse 91   Temp 98.2 F (36.8 C) (Oral)   Resp 17   LMP 05/24/2021 (Exact Date)   SpO2 98%   Visual Acuity Right Eye Distance:   Left Eye Distance:   Bilateral Distance:    Right Eye Near:   Left Eye Near:    Bilateral Near:      Physical Exam Constitutional:      General: She is not in acute distress.    Appearance: Normal appearance. She is not toxic-appearing or diaphoretic.  HENT:     Head: Normocephalic and atraumatic.  Eyes:     Extraocular Movements: Extraocular movements intact.     Conjunctiva/sclera: Conjunctivae normal.  Cardiovascular:     Rate and Rhythm: Normal rate and regular rhythm.     Pulses: Normal pulses.     Heart sounds: Normal heart sounds.  Pulmonary:     Effort: Pulmonary effort is normal. No respiratory distress.     Breath sounds: Normal breath sounds.  Skin:    Coloration: Skin is pale.  Neurological:     General: No focal deficit present.     Mental Status: She is alert and oriented to person, place, and time. Mental status is at baseline.  Psychiatric:        Mood and Affect: Mood normal.        Behavior: Behavior normal.        Thought Content: Thought content normal.        Judgment: Judgment normal.      UC Treatments / Results  Labs (all labs ordered are listed, but only abnormal results are displayed) Labs Reviewed  POCT URINE PREGNANCY - Abnormal; Notable for the following components:      Result Value   Preg Test, Ur Positive (*)    All other components within normal limits    EKG   Radiology No results found.  Procedures Procedures (including critical care time)  Medications Ordered in UC Medications - No data to display  Initial Impression / Assessment and Plan / UC Course  I have reviewed the triage vital signs and the nursing notes.  Pertinent labs & imaging results that were available during my care of the patient were reviewed by me and considered in my medical decision making (see chart for details).     There is concern for dehydration given the patient has not been able to keep any food or fluids down in the past week and has had associated 10 pound weight loss.  Pregnancy test was positive today so pregnancy was confirmed.  Due to  associated  symptoms and concern for dehydration, patient was advised to go to the maternity assessment unit at [facility] for further evaluation and management as she may need IV fluids and further evaluation.  Patient was agreeable with plan.  Vital signs stable at discharge.  Agree with patient self transport to the hospital. Final Clinical Impressions(s) / UC Diagnoses   Final diagnoses:  Pregnancy test positive  Nausea and vomiting, unspecified vomiting type     Discharge Instructions      Please go to the maternity assessment unit at [facility] as soon as you leave urgent care for further evaluation and management.  Do not sign in to the emergency department.  Please enter through emergency department or main entrance and ask where the maternity assessment unit is.     ED Prescriptions   None    PDMP not reviewed this encounter.   Gustavus Bryant, [place] 07/27/21 1137

## 2021-07-28 LAB — CULTURE, OB URINE

## 2021-09-01 ENCOUNTER — Other Ambulatory Visit: Payer: Self-pay | Admitting: Advanced Practice Midwife

## 2021-09-01 MED ORDER — ONDANSETRON 8 MG PO TBDP: 8.0000 mg | ORAL_TABLET | Freq: Three times a day (TID) | ORAL | 3 refills | Status: DC | PRN

## 2021-09-01 NOTE — Progress Notes (Signed)
Julie Ellison is a 26 y.o. G3P1010 at [redacted]w[redacted]d. She called MAU today reporting that she is out of nausea medication and she has not been able to establish care at an [facility] office yet due to insurance issues. She is requesting if we could refill this medication for her. I reviewed her chart and she was seen on 6/26 for nausea and vomiting. She was prescribed zofran 8mg  ODT #20 with 1 RF. She has not had any other hospitalizations, so it appears this medication is working well for her. I offered to provide a refill at this time and encouraged patient to call center for women's healthcare for an appointment ASAP. She said she would do that.   DNP, CNM  09/01/21  1:58 PM

## 2021-10-18 IMAGING — US US OB TRANSVAGINAL
1 series · 15 of 28 positions shown · non-contrast
Comparison: 04/20/2019

CLINICAL DATA: First trimester pregnancy, assess fetal growth,
follow-up chorionic bump; LMP 03/08/2019; EGA by first ultrasound 9
weeks 5 days

EXAM:
TRANSVAGINAL OB ULTRASOUND
TECHNIQUE: Transvaginal ultrasound was performed for complete evaluation of the
gestation as well as the maternal uterus, adnexal regions, and
pelvic cul-de-sac.

[Series 1: us ob transvaginal · 43 acquisitions, 15 frames shown]
[im 1/43]
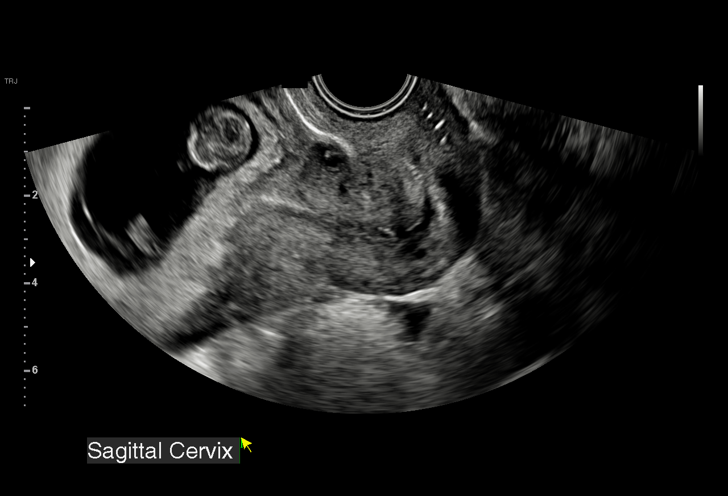
[im 4/43]
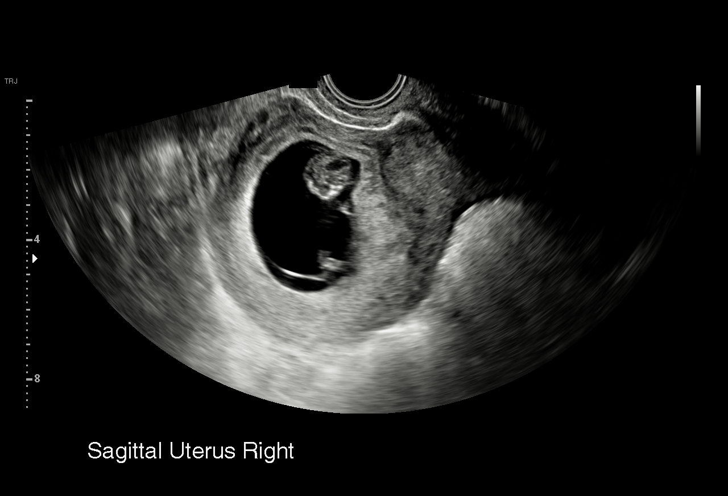
[im 7/43]
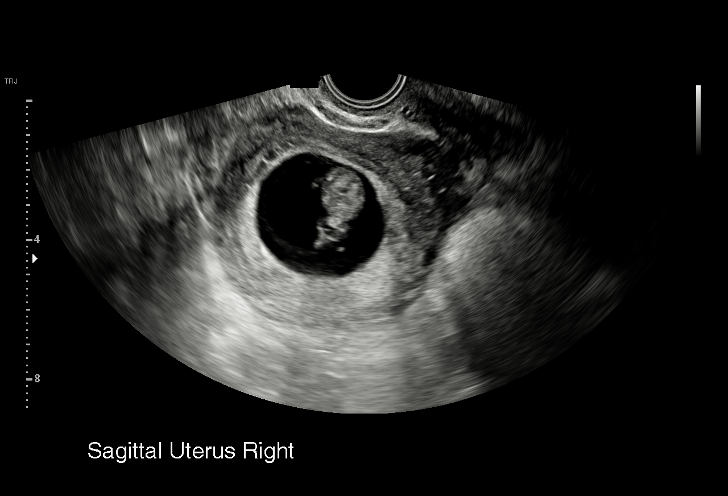
[im 10/43]
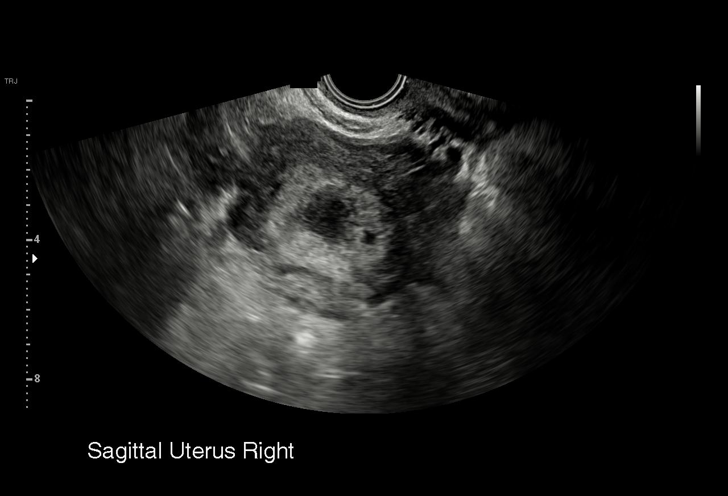
[im 13/43]
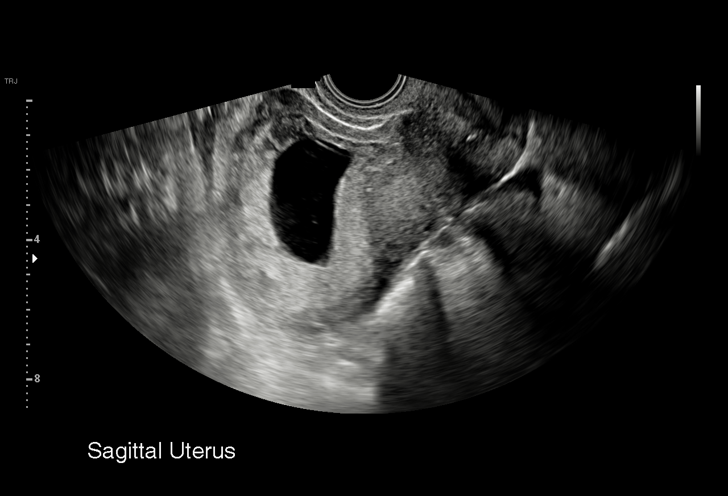
[im 16/43]
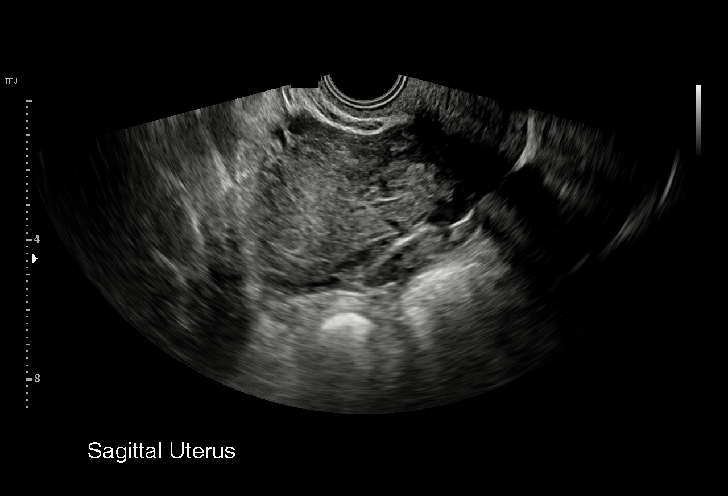
[im 19/43]
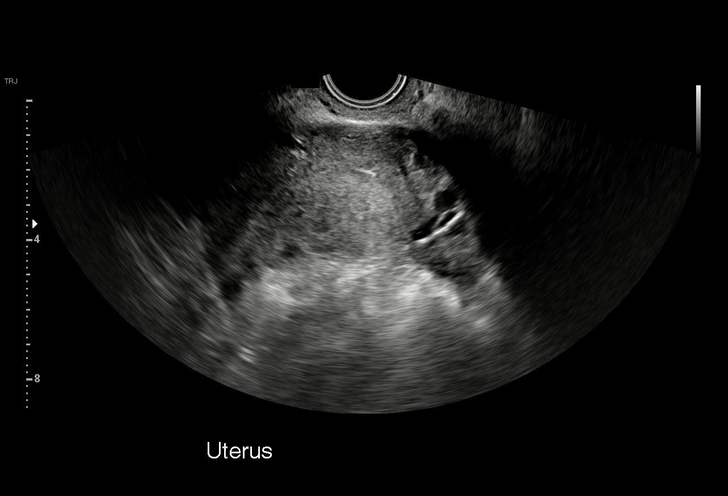
[im 22/43]
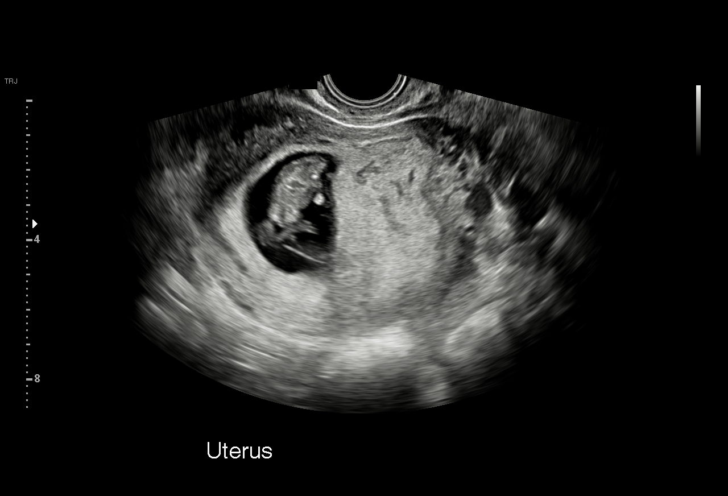
[im 24/43]
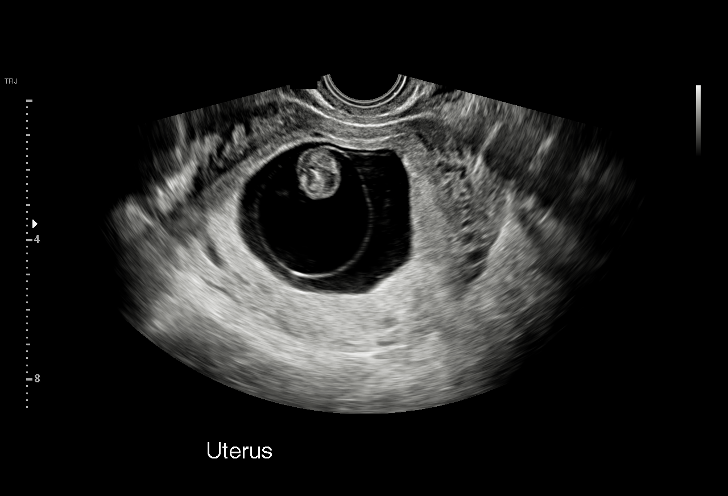
[im 27/43]
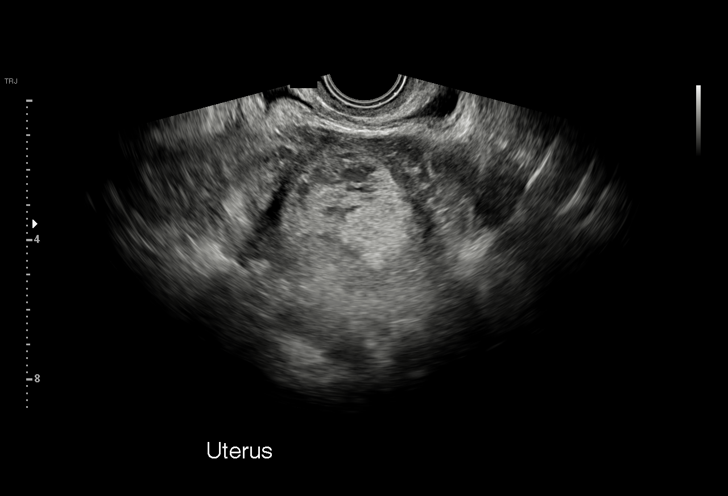
[im 30/43]
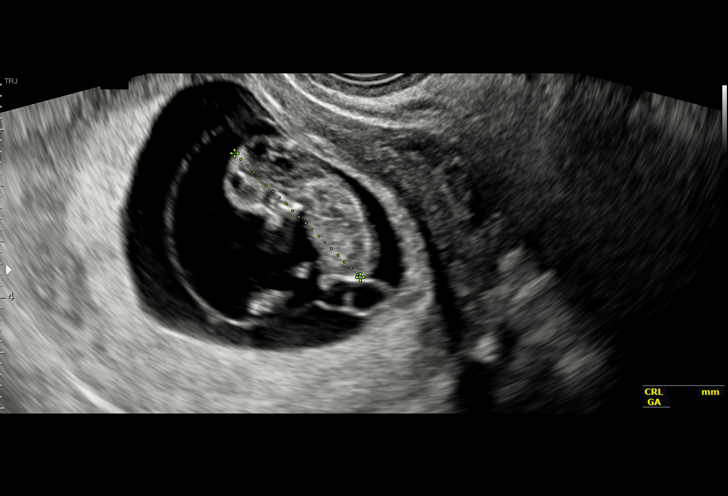
[im 33/43]
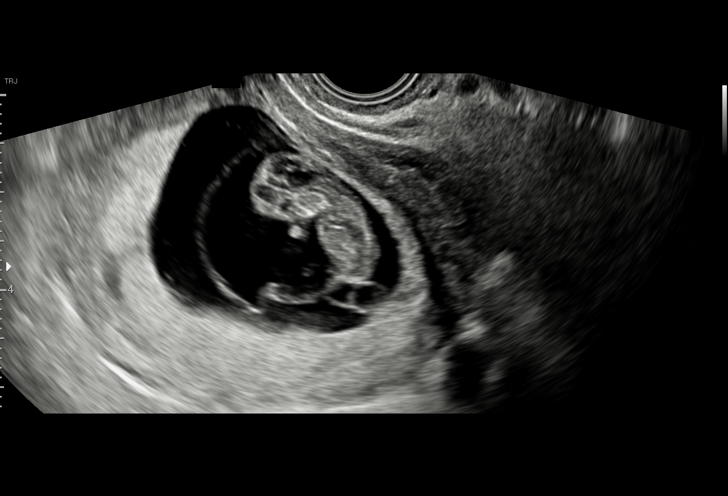
[im 36/43]
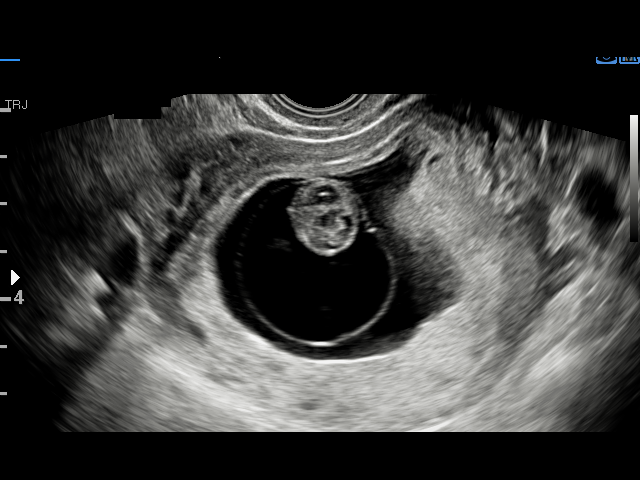
[im 39/43]
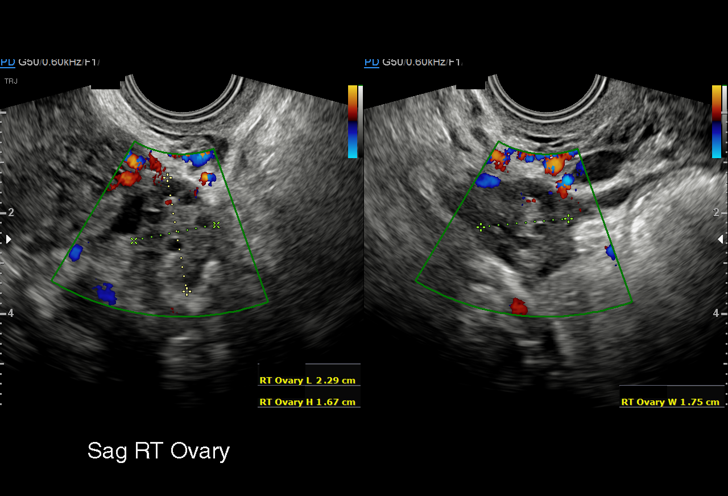
[im 43/43]
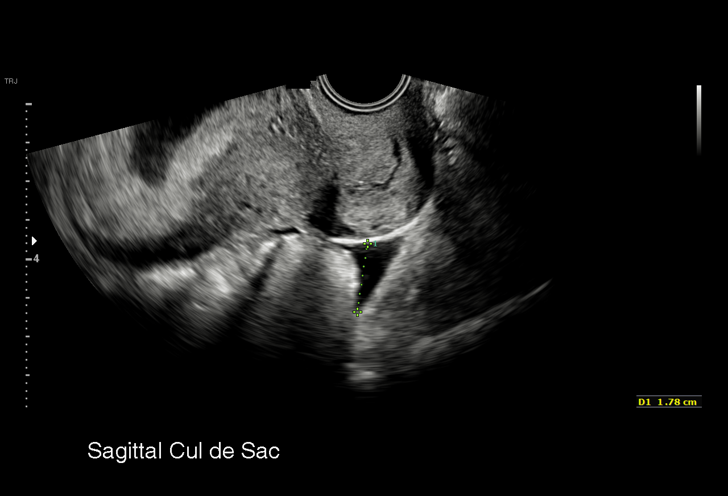

[15 of 28 positions shown; findings below may reference images not displayed]

FINDINGS: Intrauterine gestational sac: Present, single

Yolk sac:  Present

Embryo:  Present

Cardiac Activity: Present

Heart Rate: 177 bpm

CRL:   32.5 mm   10 w 1 d                  US EDC: 12/12/2019

Subchorionic hemorrhage:  None visualized.

Maternal uterus/adnexae:

Chorionic bump seen on previous exam resolved.

No maternal uterine abnormalities identified.

RIGHT ovary normal size and morphology 2.3 x 1.7 x 1.8 cm.

LEFT ovary normal size and morphology 2.7 x 2.1 x 2.0 cm, containing
a corpus luteum.

No free pelvic fluid or adnexal masses.
IMPRESSION: Single live intrauterine gestation at 10 weeks 1 day EGA by
crown-rump length.

Resolution of chorionic bump seen on previous exam.

No new sonographic abnormalities.

## 2021-10-23 ENCOUNTER — Inpatient Hospital Stay (HOSPITAL_COMMUNITY)
Admission: AD | Admit: 2021-10-23 | Discharge: 2021-10-23 | Disposition: A | Discharge status: Home / Self Care | Payer: 59 | Attending: Obstetrics and Gynecology | Admitting: Obstetrics and Gynecology

## 2021-10-23 ENCOUNTER — Encounter (HOSPITAL_COMMUNITY): Payer: Self-pay | Admitting: Obstetrics and Gynecology

## 2021-10-23 ENCOUNTER — Other Ambulatory Visit: Payer: Self-pay

## 2021-10-23 DIAGNOSIS — O26892 Other specified pregnancy related conditions, second trimester: Secondary | ICD-10-CM | POA: Insufficient documentation

## 2021-10-23 DIAGNOSIS — R002 Palpitations: Secondary | ICD-10-CM | POA: Insufficient documentation

## 2021-10-23 DIAGNOSIS — Z3A21 21 weeks gestation of pregnancy: Secondary | ICD-10-CM | POA: Diagnosis not present

## 2021-10-23 DIAGNOSIS — R102 Pelvic and perineal pain: Secondary | ICD-10-CM | POA: Insufficient documentation

## 2021-10-23 DIAGNOSIS — N949 Unspecified condition associated with female genital organs and menstrual cycle: Secondary | ICD-10-CM | POA: Diagnosis not present

## 2021-10-23 DIAGNOSIS — Z3492 Encounter for supervision of normal pregnancy, unspecified, second trimester: Secondary | ICD-10-CM

## 2021-10-23 DIAGNOSIS — R000 Tachycardia, unspecified: Secondary | ICD-10-CM | POA: Insufficient documentation

## 2021-10-23 DIAGNOSIS — O09292 Supervision of pregnancy with other poor reproductive or obstetric history, second trimester: Secondary | ICD-10-CM | POA: Diagnosis not present

## 2021-10-23 LAB — URINALYSIS, ROUTINE W REFLEX MICROSCOPIC
Bilirubin Urine: NEGATIVE
Glucose, UA: 150 mg/dL — AB
Ketones, ur: NEGATIVE mg/dL
Leukocytes,Ua: NEGATIVE
Nitrite: NEGATIVE
Protein, ur: NEGATIVE mg/dL
Specific Gravity, Urine: 1.02 (ref 1.005–1.030)
pH: 8 (ref 5.0–8.0)

## 2021-10-23 LAB — CBC
HCT: 39.8 % (ref 36.0–46.0)
Hemoglobin: 13.5 g/dL (ref 12.0–15.0)
MCH: 30 pg (ref 26.0–34.0)
MCHC: 33.9 g/dL (ref 30.0–36.0)
MCV: 88.4 fL (ref 80.0–100.0)
Platelets: 264 10*3/uL (ref 150–400)
RBC: 4.5 MIL/uL (ref 3.87–5.11)
RDW: 12 % (ref 11.5–15.5)
WBC: 11.4 10*3/uL — ABNORMAL HIGH (ref 4.0–10.5)
nRBC: 0 % (ref 0.0–0.2)

## 2021-10-23 LAB — WET PREP, GENITAL
Clue Cells Wet Prep HPF POC: NONE SEEN
Sperm: NONE SEEN
Trich, Wet Prep: NONE SEEN
WBC, Wet Prep HPF POC: <10 (ref <10)
Yeast Wet Prep HPF POC: NONE SEEN

## 2021-10-23 LAB — HEMOGLOBIN A1C
Hgb A1c MFr Bld: 4.5 % — ABNORMAL LOW (ref 4.8–5.6)
Mean Plasma Glucose: 82.45 mg/dL

## 2021-10-23 MED ORDER — CYCLOBENZAPRINE HCL 5 MG PO TABS
5.0000 mg | ORAL_TABLET | Freq: Once | ORAL | Status: AC
  Administered 2021-10-23: 5 mg via ORAL
  Filled 2021-10-23: qty 1

## 2021-10-23 MED ORDER — ACETAMINOPHEN 500 MG PO TABS
1000.0000 mg | ORAL_TABLET | Freq: Once | ORAL | Status: AC
  Administered 2021-10-23: 1000 mg via ORAL
  Filled 2021-10-23: qty 2

## 2021-10-23 NOTE — MAU Provider Note (Signed)
Chief Complaint:  Abdominal Pain   Event Date/Time   First Provider Initiated Contact with Patient 10/23/21 1131     HPI: Julie Ellison is a 26 y.o. G3P1011 at [redacted]w[redacted]d (by LMP) who presents to maternity admissions reporting lower bilateral abdominal pain that is worst at night when moving in bed and often radiates to her back. Describes it as sharp with movement then achy in between. No vaginal bleeding but increased white discharge, no vaginal discomfort. Wonders about dating since she was told she's behind her LMP dating but has been unsuccessful getting into [facility] for care (has tried 3x).  Also complaints of heart racing and palpitations without activity. No other physical complaints.  Pregnancy Course: Reviewed records from previous pregnancy and MAU visits to date.  Past Medical History:  Diagnosis Date   Hx of gonorrhea 2017   OB History  Gravida Para Term Preterm AB Living  3 1 1   1 1   SAB IAB Ectopic Multiple Live Births      1   1    # Outcome Date GA Lbr Len/2nd Weight Sex Delivery Anes PTL Lv  3 Current           2 Term 11/27/19 [redacted]w[redacted]d    Vag-Spont   LIV  1 Ectopic            Past Surgical History:  Procedure Laterality Date   DIAGNOSTIC LAPAROSCOPY WITH REMOVAL OF ECTOPIC PREGNANCY N/A 11/03/2017   Procedure: DIAGNOSTIC LAPAROSCOPY, Right Salpingectomy with removal ECTOPIC PREGNANCY;  Surgeon: Lavonia Drafts, MD;  Location: [facility];  Service: Gynecology;  Laterality: N/A;   Family History  Family history unknown: Yes   Social History   Tobacco Use   Smoking status: Former   Smokeless tobacco: Never  Scientific laboratory technician Use: Never used  Substance Use Topics   Alcohol use: Not Currently   Drug use: Never   No Known Allergies No medications prior to admission.   I have reviewed patient's Past Medical Hx, Surgical Hx, Family Hx, Social Hx, medications and allergies.   ROS:  Pertinent items noted in HPI and remainder of comprehensive ROS  otherwise negative.   Physical Exam  Patient Vitals for the past 24 hrs:  BP Temp Temp src Pulse Resp SpO2 Height Weight  10/23/21 1108 132/64 98.1 F (36.7 C) Oral (!) 109 19 100 % -- --  10/23/21 1101 -- -- -- -- -- -- 5\' 3"  (1.6 m) 177 lb 1.6 oz (80.3 kg)   Constitutional: Well-developed, well-nourished female in no acute distress.  Cardiovascular: normal rate & rhythm, warm and well-perfused Respiratory: normal effort, no problems with respiration noted GI: Abd soft, non-tender, gravid appropriate for gestational age MS: Extremities nontender, no edema, normal ROM Neurologic: Alert and oriented x 4.  GU: no CVA tenderness Pelvic: exam deferred, self-swabs obtained  FHR: 146   Labs: Results for orders placed or performed during the hospital encounter of 10/23/21 (from the past 24 hour(s))  Urinalysis, Routine w reflex microscopic Urine, Clean Catch     Status: Abnormal   Collection Time: 10/23/21 11:19 AM  Result Value Ref Range   Color, Urine YELLOW YELLOW   APPearance CLEAR CLEAR   Specific Gravity, Urine 1.020 1.005 - 1.030   pH 8.0 5.0 - 8.0   Glucose, UA 150 (A) NEGATIVE mg/dL   Hgb urine dipstick SMALL (A) NEGATIVE   Bilirubin Urine NEGATIVE NEGATIVE   Ketones, ur NEGATIVE NEGATIVE mg/dL   Protein, ur NEGATIVE  NEGATIVE mg/dL   Nitrite NEGATIVE NEGATIVE   Leukocytes,Ua NEGATIVE NEGATIVE   RBC / HPF 6-10 0 - 5 RBC/hpf   WBC, UA 0-5 0 - 5 WBC/hpf   Bacteria, UA RARE (A) NONE SEEN   Squamous Epithelial / LPF 0-5 0 - 5   Mucus PRESENT   CBC     Status: Abnormal   Collection Time: 10/23/21 11:45 AM  Result Value Ref Range   WBC 11.4 (H) 4.0 - 10.5 K/uL   RBC 4.50 3.87 - 5.11 MIL/uL   Hemoglobin 13.5 12.0 - 15.0 g/dL   HCT 39.8 36.0 - 46.0 %   MCV 88.4 80.0 - 100.0 fL   MCH 30.0 26.0 - 34.0 pg   MCHC 33.9 30.0 - 36.0 g/dL   RDW 12.0 11.5 - 15.5 %   Platelets 264 150 - 400 K/uL   nRBC 0.0 0.0 - 0.2 %  Hemoglobin A1c     Status: Abnormal   Collection Time:  10/23/21 11:45 AM  Result Value Ref Range   Hgb A1c MFr Bld 4.5 (L) 4.8 - 5.6 %   Mean Plasma Glucose 82.45 mg/dL  Wet prep, genital     Status: None   Collection Time: 10/23/21 12:07 PM   Specimen: Cervix  Result Value Ref Range   Yeast Wet Prep HPF POC NONE SEEN NONE SEEN   Trich, Wet Prep NONE SEEN NONE SEEN   Clue Cells Wet Prep HPF POC NONE SEEN NONE SEEN   WBC, Wet Prep HPF POC <10 <10   Sperm NONE SEEN    Imaging:  No results found.  MAU Course: Orders Placed This Encounter  Procedures   Wet prep, genital   Urinalysis, Routine w reflex microscopic Urine, Clean Catch   CBC   Hemoglobin A1c   AMB Referral to Cardio Obstetrics   EKG 12-Lead   EKG 12-Lead   Discharge patient   Meds ordered this encounter  Medications   acetaminophen (TYLENOL) tablet 1,000 mg   cyclobenzaprine (FLEXERIL) tablet 5 mg   MDM: A1C, CBC and EKG collected for palpitations - all normal except sinus tachycardia.  Pain relieved with Tylenol and flexeril - showed her how to do round ligament massage to prevent and relieve pain.  Message sent to [facility] (closest office to her residence) for scheduling assistance and referral placed to [facility] for tachycardia and palpitations.  Assessment: 1. Round ligament pain   2. [redacted] weeks gestation of pregnancy   3. Presence of fetal heart sounds in second trimester    Plan: Discharge home in stable condition with return precautions     [place] [facility] [place] [facility]. Schedule an appointment as soon as possible for a visit.   Specialty: Obstetrics and Gynecology Why: They should call to get you scheduled or upload to [place] but feel free to call if you do not hear by next Wednesday Contact information: [address] [place] [phone]                Allergies as of 10/23/2021   No Known Allergies      Medication List     STOP taking these  medications    famotidine 20 MG tablet Commonly known as: PEPCID   ondansetron 8 MG disintegrating tablet Commonly known as: ZOFRAN-ODT   ondansetron 8 MG tablet Commonly known as: Zofran       TAKE these medications  PRENATAL VITAMIN PO Take 1 tablet by mouth daily.       Gaylan Gerold, CNM, MSN, [place] Certified Nurse Midwife, [facility]

## 2021-10-23 NOTE — MAU Note (Addendum)
Julie Ellison is a 26 y.o. at [redacted]w[redacted]d here in MAU reporting: intermittent lower abdominal pain that began 3 days ago.  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  Also reports has noticed her heart racing without any activity.  States noticed this last week.  Denies VB.  Endorses white thick vaginal discharge, no odor. LMP: N/A Onset of complaint: 3 days ago Pain score: 0 Vitals:   10/23/21 1108  BP: 132/64  Pulse: (!) 109  Resp: 19  Temp: 98.1 F (36.7 C)  SpO2: 100%     FHT:146 bpm Lab orders placed from triage:   UA

## 2021-10-23 NOTE — Discharge Instructions (Signed)

## 2021-10-24 LAB — GC/CHLAMYDIA PROBE AMP (~~LOC~~) NOT AT ARMC
Chlamydia: NEGATIVE
Comment: NEGATIVE
Comment: NORMAL
Neisseria Gonorrhea: NEGATIVE

## 2021-11-03 ENCOUNTER — Telehealth (INDEPENDENT_AMBULATORY_CARE_PROVIDER_SITE_OTHER): Payer: 59

## 2021-11-03 DIAGNOSIS — O24419 Gestational diabetes mellitus in pregnancy, unspecified control: Secondary | ICD-10-CM | POA: Insufficient documentation

## 2021-11-03 DIAGNOSIS — Z3689 Encounter for other specified antenatal screening: Secondary | ICD-10-CM

## 2021-11-03 DIAGNOSIS — O0990 Supervision of high risk pregnancy, unspecified, unspecified trimester: Secondary | ICD-10-CM | POA: Insufficient documentation

## 2021-11-03 DIAGNOSIS — O09299 Supervision of pregnancy with other poor reproductive or obstetric history, unspecified trimester: Secondary | ICD-10-CM

## 2021-11-03 HISTORY — DX: Supervision of pregnancy with other poor reproductive or obstetric history, unspecified trimester: O09.299

## 2021-11-03 NOTE — Progress Notes (Signed)
New OB Intake  I connected with  Julie Ellison on 11/03/21 at  2:15 PM EDT by MyChart Video Visit and verified that I am speaking with the correct person using two identifiers. Nurse is located at [facility] and pt is located at home.  I discussed the limitations, risks, security and privacy concerns of performing an evaluation and management service by telephone and the availability of in person appointments. I also discussed with the patient that there may be a patient responsible charge related to this service. The patient expressed understanding and agreed to proceed.  I explained I am completing New OB Intake today. We discussed her EDD of 02/28/2022 that is based on LMP of 05/24/2021. Pt is G3/P1. I reviewed her allergies, medications, Medical/Surgical/OB history, and appropriate screenings. I informed her of [facility] services. [facility] information placed in AVS. Based on history, this is an pregnancy complicated by:   Patient Active Problem List   Diagnosis Date Noted   History of gestational diabetes in prior pregnancy, currently pregnant 11/03/2021   Supervision of high risk pregnancy, antepartum 11/03/2021   History of pre-eclampsia in prior pregnancy, currently pregnant 09/24/2019    Concerns addressed today  Delivery Plans Plans to deliver at [facility]. Patient given information for [facility] Healthy Baby website for more information about Women's and [place]. Patient is not interested in water birth. Offered upcoming OB visit with CNM to discuss further.  MyChart/Babyscripts MyChart access verified. I explained pt will have some visits in office and some virtually. Babyscripts instructions given and order placed. Patient verifies receipt of registration text/e-mail. Account successfully created and app downloaded.  Blood Pressure Cuff/Weight Scale Patient has private insurance; instructed to purchase blood pressure cuff and bring to first prenatal appt. Explained after first  prenatal appt pt will check weekly and document in 5.   Anatomy US Explained first scheduled [place] will be around 19 weeks. Anatomy US scheduled for 11/18/2021 at 2:45 PM. Pt notified to arrive at 2:30 PM.  Labs Discussed Johnsie Cancel genetic screening with patient. Would like both Panorama and Horizon drawn at new OB visit. Routine prenatal labs needed.  Covid Vaccine Patient has covid vaccine.   Is patient a CenteringPregnancy candidate?  Declined Declined due to Schedule/Times    Is patient a Mom+Baby Combined Care candidate?  Not a candidate     Social Determinants of Health Food Insecurity: Patient denies food insecurity. WIC Referral: Patient is interested in referral to [facility].  Transportation: Patient denies transportation needs. Childcare: Discussed no children allowed at ultrasound appointments. Offered childcare services; patient declines childcare services at this time.  First visit review I reviewed new OB appt with pt. I explained she will have a provider visit that includes initial ob labs, genetic screening, pap smear, and pelvic exam. Explained pt will be seen by Clayton Lefort MD at first visit; encounter routed to appropriate provider. Explained that patient will be seen by pregnancy navigator following visit with provider.   Julie Ellison, [place] 11/03/2021  2:30 PM

## 2021-11-04 ENCOUNTER — Encounter: Payer: Self-pay | Admitting: Family Medicine

## 2021-11-04 DIAGNOSIS — Z8759 Personal history of other complications of pregnancy, childbirth and the puerperium: Secondary | ICD-10-CM

## 2021-11-04 HISTORY — DX: Personal history of other complications of pregnancy, childbirth and the puerperium: Z87.59

## 2021-11-17 ENCOUNTER — Other Ambulatory Visit: Payer: Self-pay

## 2021-11-17 ENCOUNTER — Ambulatory Visit (INDEPENDENT_AMBULATORY_CARE_PROVIDER_SITE_OTHER): Payer: 59 | Admitting: Advanced Practice Midwife

## 2021-11-17 ENCOUNTER — Other Ambulatory Visit (HOSPITAL_COMMUNITY)
Admission: RE | Admit: 2021-11-17 | Discharge: 2021-11-17 | Disposition: A | Discharge status: Home / Self Care | Payer: 59 | Source: Ambulatory Visit | Attending: Family Medicine | Admitting: Family Medicine

## 2021-11-17 VITALS — BP 125/75 | HR 100 | Wt 179.5 lb

## 2021-11-17 DIAGNOSIS — O0992 Supervision of high risk pregnancy, unspecified, second trimester: Secondary | ICD-10-CM

## 2021-11-17 DIAGNOSIS — O0990 Supervision of high risk pregnancy, unspecified, unspecified trimester: Secondary | ICD-10-CM | POA: Diagnosis not present

## 2021-11-17 DIAGNOSIS — O09299 Supervision of pregnancy with other poor reproductive or obstetric history, unspecified trimester: Secondary | ICD-10-CM | POA: Diagnosis not present

## 2021-11-17 DIAGNOSIS — Z124 Encounter for screening for malignant neoplasm of cervix: Secondary | ICD-10-CM | POA: Diagnosis present

## 2021-11-17 DIAGNOSIS — Z3A25 25 weeks gestation of pregnancy: Secondary | ICD-10-CM

## 2021-11-17 DIAGNOSIS — Z8632 Personal history of gestational diabetes: Secondary | ICD-10-CM | POA: Diagnosis not present

## 2021-11-17 DIAGNOSIS — O09292 Supervision of pregnancy with other poor reproductive or obstetric history, second trimester: Secondary | ICD-10-CM

## 2021-11-17 MED ORDER — ASPIRIN 81 MG PO TBEC: 81.0000 mg | DELAYED_RELEASE_TABLET | Freq: Every day | ORAL | 2 refills | Status: DC

## 2021-11-17 NOTE — Patient Instructions (Signed)

## 2021-11-17 NOTE — Progress Notes (Signed)
INITIAL PRENATAL VISIT  Subjective:   Julie Ellison is 26 y.o. [phone] female being seen today for her first obstetrical visit. She  has not  received other prenatal care previously this pregnancy. This is a planned pregnancy. This is a desired pregnancy.  She is at [redacted]w[redacted]d gestation by LMP Her obstetrical history is significant for pre-eclampsia and A2GDM . Relationship with FOB: spouse, living together. Patient does intend to breast feed. Pregnancy history fully reviewed.  Review of Systems:   Positive for bleeding, nausea, no contractions, and vomiting.  Negative for no contractions and no leaking.  Objective:    Obstetric History OB History  Gravida Para Term Preterm AB Living  3 1 1   1 1   SAB IAB Ectopic Multiple Live Births      1   1    # Outcome Date GA Lbr Len/2nd Weight Sex Delivery Anes PTL Lv  3 Current           2 Term 11/27/19 [redacted]w[redacted]d   F Vag-Spont EPI  LIV     Complications: Preeclampsia, Gestational diabetes  1 Ectopic             Past Medical History:  Diagnosis Date   Hx of gonorrhea 2017    Past Surgical History:  Procedure Laterality Date   DIAGNOSTIC LAPAROSCOPY WITH REMOVAL OF ECTOPIC PREGNANCY N/A 11/03/2017   Procedure: DIAGNOSTIC LAPAROSCOPY, Right Salpingectomy with removal ECTOPIC PREGNANCY;  Surgeon: 01/03/2018, MD;  Location: [facility];  Service: Gynecology;  Laterality: N/A;    Current Outpatient Medications on File Prior to Visit  Medication Sig Dispense Refill   Prenatal Vit-Fe Fumarate-FA (PRENATAL VITAMIN PO) Take 1 tablet by mouth daily.     No current facility-administered medications on file prior to visit.    No Known Allergies  Social History:  reports that she has quit smoking. She has never used smokeless tobacco. She reports that she does not currently use alcohol. She reports that she does not use drugs.  Family History  Family history unknown: Yes    The following portions of the patient's history  were reviewed and updated as appropriate: allergies, current medications, past family history, past medical history, past social history, past surgical history and problem list.  Physical Exam:  BP 125/75   Pulse 100   Wt 179 lb 8 oz (81.4 kg)   LMP 05/24/2021 (Approximate)   BMI 31.80 kg/m  CONSTITUTIONAL: Well-developed, well-nourished female in no acute distress.  HENT:  Normocephalic, atraumatic. Oropharynx is clear and moist EYES: Conjunctivae normal. No scleral icterus.  SKIN: Skin is warm and dry. No rash noted. Not diaphoretic. No erythema. No pallor. MUSCULOSKELETAL: Normal range of motion. No tenderness.  No cyanosis, clubbing, or edema.   NEUROLOGIC: Alert and oriented to person, place, and time. Normal muscle tone coordination.  PSYCHIATRIC: Normal mood and affect. Normal behavior. Normal judgment and thought content. CARDIOVASCULAR: Normal heart rate noted. RESPIRATORY: Effort and rate normal. BREASTS: Declined ABDOMEN: Soft, no distention, tenderness, rebound or guarding. Fundal ht: 25  PELVIC: Normal appearing external genitalia; normal appearing vaginal mucosa and cervix.  No abnormal discharge noted.  Pap smear obtained.  Uterus S=D, no other palpable masses, no uterine or adnexal tenderness. Fetal Status: Fetal Heart Rate (bpm): 145   Movement: Present       Assessment:   Pregnancy: G3P1011 1. Supervision of high risk pregnancy, antepartum - 28 week labs NV - CHL AMB BABYSCRIPTS OPT IN - Culture, OB Urine -  CBC/D/Plt+RPR+Rh+ABO+RubIgG... - Panorama Prenatal Test Full Panel  2. History of pre-eclampsia in prior pregnancy, currently pregnant  - Culture, OB Urine - CBC/D/Plt+RPR+Rh+ABO+RubIgG... - Panorama Prenatal Test Full Panel - aspirin EC 81 MG tablet; Take 1 tablet (81 mg total) by mouth daily. Take after 12 weeks for prevention of preeclampssia later in pregnancy  Dispense: 300 tablet; Refill: 2  3. History of gestational diabetes in prior pregnancy,  currently pregnant  - Culture, OB Urine - CBC/D/Plt+RPR+Rh+ABO+RubIgG... - Panorama Prenatal Test Full Panel  4. [redacted] weeks gestation of pregnancy  - Culture, OB Urine - CBC/D/Plt+RPR+Rh+ABO+RubIgG... - Panorama Prenatal Test Full Panel    Plan:  Initial labs drawn/reviewed. Prenatal vitamins. Rx ASA for reduction of risk for preeclampsia.  Problem list reviewed and updated. Genetic screening discussed: NIPS/First trimester screen/Quad/AFP ordered. Role of ultrasound in pregnancy discussed; Anatomy US: ordered. Amniocentesis discussed: not indicated. Follow up in 2 weeks. (Traditional/Centering/Mom-Baby Combined Care) Discussed clinic routines, schedule of care and testing, genetic screening options, involvement of students and residents under the direct supervision of APPs and doctors and presence of female providers. Pt verbalized understanding.  [place], [place] 11/17/2021 9:44 AM

## 2021-11-18 ENCOUNTER — Ambulatory Visit: Payer: 59 | Attending: Family Medicine

## 2021-11-18 ENCOUNTER — Ambulatory Visit: Payer: 59

## 2021-11-18 VITALS — BP 120/57 | HR 93

## 2021-11-18 DIAGNOSIS — O0990 Supervision of high risk pregnancy, unspecified, unspecified trimester: Secondary | ICD-10-CM | POA: Diagnosis present

## 2021-11-18 DIAGNOSIS — O0932 Supervision of pregnancy with insufficient antenatal care, second trimester: Secondary | ICD-10-CM | POA: Diagnosis not present

## 2021-11-18 DIAGNOSIS — O24112 Pre-existing diabetes mellitus, type 2, in pregnancy, second trimester: Secondary | ICD-10-CM | POA: Insufficient documentation

## 2021-11-18 DIAGNOSIS — E669 Obesity, unspecified: Secondary | ICD-10-CM

## 2021-11-18 DIAGNOSIS — Z3A24 24 weeks gestation of pregnancy: Secondary | ICD-10-CM | POA: Insufficient documentation

## 2021-11-18 DIAGNOSIS — O09292 Supervision of pregnancy with other poor reproductive or obstetric history, second trimester: Secondary | ICD-10-CM | POA: Diagnosis not present

## 2021-11-18 DIAGNOSIS — Z363 Encounter for antenatal screening for malformations: Secondary | ICD-10-CM | POA: Insufficient documentation

## 2021-11-18 DIAGNOSIS — O99212 Obesity complicating pregnancy, second trimester: Secondary | ICD-10-CM | POA: Insufficient documentation

## 2021-11-18 DIAGNOSIS — O09299 Supervision of pregnancy with other poor reproductive or obstetric history, unspecified trimester: Secondary | ICD-10-CM

## 2021-11-18 DIAGNOSIS — Z3687 Encounter for antenatal screening for uncertain dates: Secondary | ICD-10-CM | POA: Diagnosis not present

## 2021-11-18 LAB — CBC/D/PLT+RPR+RH+ABO+RUBIGG...
Antibody Screen: NEGATIVE
Basophils Absolute: 0.1 10*3/uL (ref 0.0–0.2)
Basos: 0 %
EOS (ABSOLUTE): 0.1 10*3/uL (ref 0.0–0.4)
Eos: 1 %
HCV Ab: NONREACTIVE
HIV Screen 4th Generation wRfx: NONREACTIVE
Hematocrit: 41.7 % (ref 34.0–46.6)
Hemoglobin: 14.2 g/dL (ref 11.1–15.9)
Hepatitis B Surface Ag: NEGATIVE
Immature Grans (Abs): 0 10*3/uL (ref 0.0–0.1)
Immature Granulocytes: 0 %
Lymphocytes Absolute: 2.3 10*3/uL (ref 0.7–3.1)
Lymphs: 18 %
MCH: 29.8 pg (ref 26.6–33.0)
MCHC: 34.1 g/dL (ref 31.5–35.7)
MCV: 88 fL (ref 79–97)
Monocytes Absolute: 0.5 10*3/uL (ref 0.1–0.9)
Monocytes: 4 %
Neutrophils Absolute: 9.8 10*3/uL — ABNORMAL HIGH (ref 1.4–7.0)
Neutrophils: 77 %
Platelets: 285 10*3/uL (ref 150–450)
RBC: 4.76 x10E6/uL (ref 3.77–5.28)
RDW: 11.7 % (ref 11.7–15.4)
RPR Ser Ql: NONREACTIVE
Rh Factor: POSITIVE
Rubella Antibodies, IGG: 1.96 index (ref >0.99)
WBC: 12.8 10*3/uL — ABNORMAL HIGH (ref 3.4–10.8)

## 2021-11-18 LAB — HCV INTERPRETATION

## 2021-11-19 ENCOUNTER — Encounter: Payer: Self-pay | Admitting: *Deleted

## 2021-11-19 ENCOUNTER — Other Ambulatory Visit: Payer: Self-pay | Admitting: *Deleted

## 2021-11-19 DIAGNOSIS — Z3492 Encounter for supervision of normal pregnancy, unspecified, second trimester: Secondary | ICD-10-CM

## 2021-11-19 DIAGNOSIS — O24419 Gestational diabetes mellitus in pregnancy, unspecified control: Secondary | ICD-10-CM

## 2021-11-19 DIAGNOSIS — O09299 Supervision of pregnancy with other poor reproductive or obstetric history, unspecified trimester: Secondary | ICD-10-CM

## 2021-11-19 LAB — CYTOLOGY - PAP
Chlamydia: NEGATIVE
Comment: NEGATIVE
Comment: NEGATIVE
Comment: NORMAL
Diagnosis: NEGATIVE
High risk HPV: NEGATIVE
Neisseria Gonorrhea: NEGATIVE

## 2021-11-19 LAB — CULTURE, OB URINE

## 2021-11-19 LAB — URINE CULTURE, OB REFLEX

## 2021-11-23 LAB — PANORAMA PRENATAL TEST FULL PANEL:PANORAMA TEST PLUS 5 ADDITIONAL MICRODELETIONS: FETAL FRACTION: 13.4

## 2021-11-27 ENCOUNTER — Encounter: Payer: Self-pay | Admitting: Advanced Practice Midwife

## 2021-11-30 ENCOUNTER — Other Ambulatory Visit: Payer: Self-pay | Admitting: *Deleted

## 2021-11-30 DIAGNOSIS — O0990 Supervision of high risk pregnancy, unspecified, unspecified trimester: Secondary | ICD-10-CM

## 2021-12-02 ENCOUNTER — Other Ambulatory Visit: Payer: Self-pay

## 2021-12-02 ENCOUNTER — Ambulatory Visit (INDEPENDENT_AMBULATORY_CARE_PROVIDER_SITE_OTHER): Payer: 59 | Admitting: Family Medicine

## 2021-12-02 ENCOUNTER — Other Ambulatory Visit: Payer: 59

## 2021-12-02 VITALS — BP 131/72 | HR 103 | Wt 186.3 lb

## 2021-12-02 DIAGNOSIS — Z23 Encounter for immunization: Secondary | ICD-10-CM

## 2021-12-02 DIAGNOSIS — O0990 Supervision of high risk pregnancy, unspecified, unspecified trimester: Secondary | ICD-10-CM

## 2021-12-02 DIAGNOSIS — O09292 Supervision of pregnancy with other poor reproductive or obstetric history, second trimester: Secondary | ICD-10-CM

## 2021-12-02 DIAGNOSIS — O09299 Supervision of pregnancy with other poor reproductive or obstetric history, unspecified trimester: Secondary | ICD-10-CM

## 2021-12-02 DIAGNOSIS — R000 Tachycardia, unspecified: Secondary | ICD-10-CM

## 2021-12-02 DIAGNOSIS — Z8632 Personal history of gestational diabetes: Secondary | ICD-10-CM

## 2021-12-02 DIAGNOSIS — O0992 Supervision of high risk pregnancy, unspecified, second trimester: Secondary | ICD-10-CM

## 2021-12-02 DIAGNOSIS — Z3A26 26 weeks gestation of pregnancy: Secondary | ICD-10-CM

## 2021-12-02 MED ORDER — ASPIRIN 81 MG PO TBEC: 81.0000 mg | DELAYED_RELEASE_TABLET | Freq: Every day | ORAL | 2 refills | Status: DC

## 2021-12-02 NOTE — Addendum Note (Signed)
Addended by: Caralee Ates on: 12/02/2021 10:11 AM   Modules accepted: Orders

## 2021-12-02 NOTE — Patient Instructions (Signed)
Go to [facility] if persistent headache and/or vision changes, nausea, vomiting, right upper belly pain.

## 2021-12-02 NOTE — Progress Notes (Cosign Needed Addendum)
   PRENATAL VISIT NOTE  Subjective:  Julie Ellison is a 26 y.o. G3P1011 at [redacted]w[redacted]d being seen today for ongoing prenatal care.  She is currently monitored for the following issues for this high-risk pregnancy and has History of pre-eclampsia in prior pregnancy, currently pregnant; History of gestational diabetes in prior pregnancy, currently pregnant; Supervision of high risk pregnancy, antepartum; and History of ectopic pregnancy on their problem list.  Patient reports no complaints. Had 1 episode of feeling short of breath last week. Contractions: Not present. Vag. Bleeding: None.  Movement: Present. Denies leaking of fluid.   The following portions of the patient's history were reviewed and updated as appropriate: allergies, current medications, past family history, past medical history, past social history, past surgical history and problem list.   Objective:   Vitals:   12/02/21 0834 12/02/21 0908  BP: 136/69 131/72  Pulse: (!) 102 (!) 103  Weight: 186 lb 4.8 oz (84.5 kg)     Fetal Status: Fetal Heart Rate (bpm): 155 Fundal Height: 28 cm Movement: Present     General:  Alert, oriented and cooperative. Patient is in no acute distress.  Skin: Skin is warm and dry. No rash noted.   Cardiovascular: RRR. No murmurs.   Respiratory: Normal respiratory effort, no problems with respiration noted  Abdomen: Soft, gravid, appropriate for gestational age.  Pain/Pressure: Absent     Pelvic: Cervical exam deferred        Extremities: Normal range of motion.  Edema: None  Mental Status: Normal mood and affect. Normal behavior. Normal judgment and thought content.   Assessment and Plan:  Pregnancy: G3P1011 at [redacted]w[redacted]d 1. Supervision of high risk pregnancy, antepartum Doing well, no concerns today.  2. Need for Tdap vaccination  3. [redacted] weeks gestation of pregnancy Follow up in 4 weeks. Plan to get flu vaccine at follow up.   4. History of pre-eclampsia in prior pregnancy, currently  pregnant Needs baseline UPC ratio at follow up. No symptoms today. Continue to monitor BP.  Discussed ASA today. Previously discussed and prescribed. Encouraged to start.   5. History of gestational diabetes in prior pregnancy, currently pregnant 2hrgtt today  6. Tachycardia Appears chronic per epic review. Consider TSH at follow up visit. Consider EKG if symptomatic.    Preterm labor symptoms and general obstetric precautions including but not limited to vaginal bleeding, contractions, leaking of fluid and fetal movement were reviewed in detail with the patient. Please refer to After Visit Summary for other counseling recommendations.   Return in about 4 weeks (around 12/30/2021) for HROB follow up.  Future Appointments  Date Time Provider [place]  12/18/2021  9:15 AM [facility] NURSE [facility]  12/18/2021  9:30 AM [facility] US2 [facility]  12/23/2021  9:55 AM Gabriel Carina, CNM [facility]  01/06/2022  9:35 AM Gabriel Carina, CNM [facility]  01/20/2022  9:35 AM Gabriel Carina, CNM [facility] [place], DO

## 2021-12-03 ENCOUNTER — Telehealth: Payer: Self-pay | Admitting: *Deleted

## 2021-12-03 ENCOUNTER — Telehealth: Payer: 59 | Admitting: Family Medicine

## 2021-12-03 DIAGNOSIS — R197 Diarrhea, unspecified: Secondary | ICD-10-CM

## 2021-12-03 DIAGNOSIS — O219 Vomiting of pregnancy, unspecified: Secondary | ICD-10-CM

## 2021-12-03 LAB — HIV ANTIBODY (ROUTINE TESTING W REFLEX): HIV Screen 4th Generation wRfx: NONREACTIVE

## 2021-12-03 LAB — GLUCOSE TOLERANCE, 2 HOURS W/ 1HR
Glucose, 1 hour: 167 mg/dL (ref 70–179)
Glucose, 2 hour: 104 mg/dL (ref 70–152)
Glucose, Fasting: 106 mg/dL — ABNORMAL HIGH (ref 70–91)

## 2021-12-03 LAB — CBC
Hematocrit: 40.4 % (ref 34.0–46.6)
Hemoglobin: 13.7 g/dL (ref 11.1–15.9)
MCH: 29.5 pg (ref 26.6–33.0)
MCHC: 33.9 g/dL (ref 31.5–35.7)
MCV: 87 fL (ref 79–97)
Platelets: 276 10*3/uL (ref 150–450)
RBC: 4.65 x10E6/uL (ref 3.77–5.28)
RDW: 11.6 % — ABNORMAL LOW (ref 11.7–15.4)
WBC: 10.7 10*3/uL (ref 3.4–10.8)

## 2021-12-03 LAB — RPR: RPR Ser Ql: NONREACTIVE

## 2021-12-03 MED ORDER — PROMETHAZINE HCL 25 MG PO TABS: 25.0000 mg | ORAL_TABLET | Freq: Four times a day (QID) | ORAL | 0 refills | Status: DC | PRN

## 2021-12-03 NOTE — Progress Notes (Signed)
[place]   Pregnant having diarrhea and vomiting along with pain. Needs in person eval.

## 2021-12-03 NOTE — Telephone Encounter (Signed)
Call transferred from front desk. Patient reports nausea , vomiting , diarrhea that started yesterday. States after she got  home from her 2 hour GTT she felt bad , ate breakfast then vomited. States doesn't think is related to GTT. States she vomited a few more times, and then diarrhea started at 8pm and continued thru the night. We discussed she may have a GI bug and hopefully will run its course soon. She is trying to take sips of clear liquids, chips of ice.She reports good fetal movement, denies vaginal bleeding or contractions.  Advised may send in RX for Phenergan, she may use OTC Immodium AD or Kaopectate. Continue sips and chips of fluids/ ice. Discussed if she feels dehydrated, lightheaded or dizzy needs to go to hospital for IV fluids/ evaluation. She request RX for Phenergan and wants to try Phenergan and Immodium first.  Staci Acosta

## 2021-12-07 ENCOUNTER — Telehealth: Payer: Self-pay | Admitting: *Deleted

## 2021-12-07 ENCOUNTER — Encounter: Payer: Self-pay | Admitting: Family Medicine

## 2021-12-07 DIAGNOSIS — O24419 Gestational diabetes mellitus in pregnancy, unspecified control: Secondary | ICD-10-CM

## 2021-12-07 MED ORDER — ACCU-CHEK SOFTCLIX LANCETS MISC: 12 refills | Status: DC

## 2021-12-07 MED ORDER — ACCU-CHEK GUIDE W/DEVICE KIT: PACK | 0 refills | Status: DC

## 2021-12-07 MED ORDER — ACCU-CHEK GUIDE VI STRP: ORAL_STRIP | 12 refills | Status: DC

## 2021-12-07 NOTE — Telephone Encounter (Addendum)
-----   Message from Donnamae Jude, MD sent at 12/07/2021  7:47 AM EST ----- Has GDM--please schedule for education and teaching.  11/6  2542  Called pt and informed her of test results positive for Gestational Diabetes. Pt acknowledges that she had GDM with past pregnancy. Pt should obtain meter and testing supplies which have been sent to pharmacy and brig to appt on 11/16 @ 11;15.  Pt voiced understanding and agreed to plan of care.

## 2021-12-07 NOTE — Addendum Note (Signed)
Addended by: Langston Reusing on: 12/07/2021 09:54 AM   Modules accepted: Orders

## 2021-12-17 ENCOUNTER — Encounter: Payer: 59 | Attending: Family Medicine | Admitting: Registered"

## 2021-12-17 ENCOUNTER — Ambulatory Visit (INDEPENDENT_AMBULATORY_CARE_PROVIDER_SITE_OTHER): Payer: 59 | Admitting: Registered"

## 2021-12-17 ENCOUNTER — Other Ambulatory Visit: Payer: Self-pay

## 2021-12-17 DIAGNOSIS — O24419 Gestational diabetes mellitus in pregnancy, unspecified control: Secondary | ICD-10-CM | POA: Diagnosis not present

## 2021-12-17 DIAGNOSIS — Z3A29 29 weeks gestation of pregnancy: Secondary | ICD-10-CM | POA: Diagnosis not present

## 2021-12-17 DIAGNOSIS — O09299 Supervision of pregnancy with other poor reproductive or obstetric history, unspecified trimester: Secondary | ICD-10-CM | POA: Insufficient documentation

## 2021-12-17 NOTE — Patient Instructions (Signed)
Snack around 3 pm to avoid being to hungry at 5 pm. Aim to eat vegetables daily and include beans often. To help reduce fasting blood sugar: Exercise 15-20 min in the evening and continue eating smaller dinners since you have to eat late after work.

## 2021-12-17 NOTE — Progress Notes (Signed)
Patient was seen for Gestational Diabetes self-management on 12/17/2021  Start time 1125 and End time 1214   Estimated due date: 03/30/2022; [redacted]w[redacted]d  Clinical: Medications: reviewed Medical History: GDM 2 yrs ago controlled with medication Labs: OGTT 106 FBS, A1c 4.5%   Dietary and Lifestyle History: Patient had family members help her with GDM management 2 yrs ago.  10:30 eats breakfast; 1-5 pm lunch at work (veggie fajitas, beans, 3 corn tortillas); 9 pm dinner; has a hard time staying awake 2 hrs to get last PPBG.  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 Pt reports she just moved into a new house and has had to rely on fast food but will be settled within a few days and will start eating more at home.  Pt states she is motivated to avoid insulin doesn't want to have to inject insulin.  Pt states she hasn't liked milk during pregnancy because it prolongs taste of meal giving a lingering flavor on back of tongue that makes her nauseas.  Pt reports she snacks around 3 pm to avoid being to hungry at 5 pm.  Physical Activity: ADL (active at work) Stress: not assessed Sleep: not assessed  24 hr Recall:  First Meal: eggs peppers onions tomato 3 corn tortillas OR chicken bowl at [employer], 1 packet of honey, orange juice Snack: Second meal: burger, water Snack: Third meal: bojangles 4 chicken strips, coleslaw, water Snack: Beverages: water, orange juice  NUTRITION INTERVENTION  Nutrition education (E-1) on the following topics:   Initial Follow-up  [x]  []  Definition of Gestational Diabetes [x]  []  Why dietary management is important in controlling blood glucose [x]  []  Effects each nutrient has on blood glucose levels [x]  []  Simple carbohydrates vs complex carbohydrates [x]  []  Fluid intake [x]  []  Creating a balanced meal plan [x]  []  Carbohydrate counting  [x]  []  When to check blood glucose levels [x]  []  Proper blood glucose  monitoring techniques [x]  []  Effect of stress and stress reduction techniques  [x]  []  Exercise effect on blood glucose levels, appropriate exercise during pregnancy [x]  []  Importance of limiting caffeine and abstaining from alcohol and smoking [x]  []  Medications used for blood sugar control during pregnancy [x]  []  Hypoglycemia and rule of 15 [x]  []  Postpartum self care  Blood glucose monitor given: Patient has a meter prior to visit.  Patient is testing pre breakfast and 2 hours after each meal. FBS: 100-110 mg/dL Postprandial: avg, once was 170 after sushi  Patient instructed to monitor glucose levels: FBS: 60 - ? 95 mg/dL (some clinics use 90 for cutoff) 1 hour: ? 140 mg/dL 2 hour: ? mg/dL  Patient received handouts: Nutrition Diabetes and Pregnancy Carbohydrate Counting List  Patient will be seen for follow-up as needed.

## 2021-12-18 ENCOUNTER — Ambulatory Visit: Payer: 59 | Admitting: *Deleted

## 2021-12-18 ENCOUNTER — Other Ambulatory Visit: Payer: Self-pay | Admitting: *Deleted

## 2021-12-18 ENCOUNTER — Ambulatory Visit: Payer: 59 | Attending: Maternal & Fetal Medicine

## 2021-12-18 VITALS — BP 109/62 | HR 93

## 2021-12-18 DIAGNOSIS — O09299 Supervision of pregnancy with other poor reproductive or obstetric history, unspecified trimester: Secondary | ICD-10-CM | POA: Diagnosis not present

## 2021-12-18 DIAGNOSIS — O09293 Supervision of pregnancy with other poor reproductive or obstetric history, third trimester: Secondary | ICD-10-CM | POA: Diagnosis not present

## 2021-12-18 DIAGNOSIS — Z3A28 28 weeks gestation of pregnancy: Secondary | ICD-10-CM

## 2021-12-18 DIAGNOSIS — O0990 Supervision of high risk pregnancy, unspecified, unspecified trimester: Secondary | ICD-10-CM | POA: Diagnosis not present

## 2021-12-18 DIAGNOSIS — O0933 Supervision of pregnancy with insufficient antenatal care, third trimester: Secondary | ICD-10-CM

## 2021-12-18 DIAGNOSIS — E669 Obesity, unspecified: Secondary | ICD-10-CM

## 2021-12-18 DIAGNOSIS — O99213 Obesity complicating pregnancy, third trimester: Secondary | ICD-10-CM | POA: Diagnosis not present

## 2021-12-18 DIAGNOSIS — O24410 Gestational diabetes mellitus in pregnancy, diet controlled: Secondary | ICD-10-CM

## 2021-12-18 DIAGNOSIS — O24419 Gestational diabetes mellitus in pregnancy, unspecified control: Secondary | ICD-10-CM | POA: Diagnosis not present

## 2021-12-18 DIAGNOSIS — Z3492 Encounter for supervision of normal pregnancy, unspecified, second trimester: Secondary | ICD-10-CM | POA: Diagnosis not present

## 2021-12-23 ENCOUNTER — Ambulatory Visit (INDEPENDENT_AMBULATORY_CARE_PROVIDER_SITE_OTHER): Payer: 59 | Admitting: Certified Nurse Midwife

## 2021-12-23 ENCOUNTER — Other Ambulatory Visit: Payer: Self-pay

## 2021-12-23 VITALS — BP 122/71 | Wt 178.3 lb

## 2021-12-23 DIAGNOSIS — O24419 Gestational diabetes mellitus in pregnancy, unspecified control: Secondary | ICD-10-CM

## 2021-12-23 DIAGNOSIS — O09299 Supervision of pregnancy with other poor reproductive or obstetric history, unspecified trimester: Secondary | ICD-10-CM

## 2021-12-23 DIAGNOSIS — O0993 Supervision of high risk pregnancy, unspecified, third trimester: Secondary | ICD-10-CM

## 2021-12-23 DIAGNOSIS — Z3A29 29 weeks gestation of pregnancy: Secondary | ICD-10-CM

## 2021-12-23 DIAGNOSIS — O09293 Supervision of pregnancy with other poor reproductive or obstetric history, third trimester: Secondary | ICD-10-CM

## 2021-12-23 DIAGNOSIS — O0990 Supervision of high risk pregnancy, unspecified, unspecified trimester: Secondary | ICD-10-CM

## 2021-12-24 MED ORDER — METFORMIN HCL 500 MG PO TABS: 500.0000 mg | ORAL_TABLET | Freq: Every day | ORAL | 2 refills | Status: DC

## 2021-12-24 NOTE — Progress Notes (Signed)
   PRENATAL VISIT NOTE  Subjective:  Julie Ellison is a 26 y.o. G3P1011 at [redacted]w[redacted]d being seen today for ongoing prenatal care.  She is currently monitored for the following issues for this high-risk pregnancy and has History of pre-eclampsia in prior pregnancy, currently pregnant; Gestational diabetes mellitus (GDM) affecting pregnancy, antepartum; Supervision of high risk pregnancy, antepartum; and History of ectopic pregnancy on their problem list.  Patient reports no complaints.  Contractions: Not present. Vag. Bleeding: None.  Movement: Present. Denies leaking of fluid.   The following portions of the patient's history were reviewed and updated as appropriate: allergies, current medications, past family history, past medical history, past social history, past surgical history and problem list.   Objective:   Vitals:   12/23/21 1049  BP: 122/71  Weight: 178 lb 4.8 oz (80.9 kg)    Fetal Status: Fetal Heart Rate (bpm): 150 Fundal Height: 29 cm Movement: Present     General:  Alert, oriented and cooperative. Patient is in no acute distress.  Skin: Skin is warm and dry. No rash noted.   Cardiovascular: Normal heart rate noted  Respiratory: Normal respiratory effort, no problems with respiration noted  Abdomen: Soft, gravid, appropriate for gestational age.  Pain/Pressure: Absent     Pelvic: Cervical exam deferred        Extremities: Normal range of motion.  Edema: None  Mental Status: Normal mood and affect. Normal behavior. Normal judgment and thought content.   Assessment and Plan:  Pregnancy: G3P1011 at [redacted]w[redacted]d 1. Supervision of high risk pregnancy, antepartum - Doing well, feeling regular and vigorous fetal movement   2. [redacted] weeks gestation of pregnancy - Routine OB care   3. Gestational diabetes mellitus (GDM) affecting pregnancy, antepartum - Did not bring log but pt recalls almost all fasting values out of range, only one post-prandial out of range and it was when  she ate a large carb heavy meal. - Notes she goes long periods between eating - usually eats breakfast before work, then eats again at 5pm and 9-10pm. Brainstormed ways she can get in calories throughout the day which will help prevent her fasting levels from being elevated. - metFORMIN (GLUCOPHAGE) 500 MG tablet; Take 1 tablet (500 mg total) by mouth daily after supper.  Dispense: 60 tablet; Refill: 2  4. History of pre-eclampsia in prior pregnancy, currently pregnant - BP normal but will get baseline P:Cr today - Protein / creatinine ratio, urine  Preterm labor symptoms and general obstetric precautions including but not limited to vaginal bleeding, contractions, leaking of fluid and fetal movement were reviewed in detail with the patient. Please refer to After Visit Summary for other counseling recommendations.   Return in about 2 weeks (around 01/06/2022) for IN-PERSON, HOB.  Future Appointments  Date Time Provider Department Center  01/06/2022  9:35 AM Osborne Oman [facility]  01/15/2022 10:30 AM [facility] NURSE [facility]  01/15/2022 10:45 AM [facility] US4 [facility]  01/20/2022  9:35 AM Dan Humphreys, Judye Bos, CNM [facility]    Bernerd Limbo, CNM

## 2021-12-25 LAB — PROTEIN / CREATININE RATIO, URINE
Creatinine, Urine: 124.9 mg/dL
Protein, Ur: 38.5 mg/dL
Protein/Creat Ratio: 308 mg/g creat — ABNORMAL HIGH (ref 0–200)

## 2021-12-28 ENCOUNTER — Encounter: Payer: Self-pay | Admitting: Certified Nurse Midwife

## 2021-12-28 DIAGNOSIS — R809 Proteinuria, unspecified: Secondary | ICD-10-CM | POA: Insufficient documentation

## 2021-12-31 ENCOUNTER — Encounter: Payer: Self-pay | Admitting: Certified Nurse Midwife

## 2022-01-06 ENCOUNTER — Other Ambulatory Visit: Payer: Self-pay

## 2022-01-06 ENCOUNTER — Ambulatory Visit (INDEPENDENT_AMBULATORY_CARE_PROVIDER_SITE_OTHER): Payer: Self-pay | Admitting: Certified Nurse Midwife

## 2022-01-06 VITALS — BP 106/61 | HR 105 | Wt 180.6 lb

## 2022-01-06 DIAGNOSIS — O0993 Supervision of high risk pregnancy, unspecified, third trimester: Secondary | ICD-10-CM

## 2022-01-06 DIAGNOSIS — O09299 Supervision of pregnancy with other poor reproductive or obstetric history, unspecified trimester: Secondary | ICD-10-CM

## 2022-01-06 DIAGNOSIS — O09293 Supervision of pregnancy with other poor reproductive or obstetric history, third trimester: Secondary | ICD-10-CM

## 2022-01-06 DIAGNOSIS — Z3A31 31 weeks gestation of pregnancy: Secondary | ICD-10-CM

## 2022-01-06 DIAGNOSIS — O24419 Gestational diabetes mellitus in pregnancy, unspecified control: Secondary | ICD-10-CM

## 2022-01-06 MED ORDER — METFORMIN HCL 500 MG PO TABS: 1000.0000 mg | ORAL_TABLET | Freq: Every day | ORAL | 2 refills | Status: DC

## 2022-01-06 NOTE — Addendum Note (Signed)
Addended by: Edd Arbour on: 01/06/2022 08:20 PM   Modules accepted: Orders

## 2022-01-06 NOTE — Progress Notes (Signed)
   PRENATAL VISIT NOTE  Subjective:  Julie Ellison is a 26 y.o. G3P1011 at [redacted]w[redacted]d being seen today for ongoing prenatal care.  She is currently monitored for the following issues for this high-risk pregnancy and has History of pre-eclampsia in prior pregnancy, currently pregnant; Gestational diabetes mellitus (GDM) affecting pregnancy, antepartum; Supervision of high-risk pregnancy; History of ectopic pregnancy; and Proteinuria on their problem list.  Patient reports no complaints. Contractions: Not present. Vag. Bleeding: None.  Movement: Present. Denies leaking of fluid.   The following portions of the patient's history were reviewed and updated as appropriate: allergies, current medications, past family history, past medical history, past social history, past surgical history and problem list.   Objective:   Vitals:   01/06/22 1015  BP: 106/61  Pulse: (!) 105  Weight: 180 lb 9.6 oz (81.9 kg)    Fetal Status: Fetal Heart Rate (bpm): 140 Fundal Height: 31 cm Movement: Present     General:  Alert, oriented and cooperative. Patient is in no acute distress.  Skin: Skin is warm and dry. No rash noted.   Cardiovascular: Normal heart rate noted  Respiratory: Normal respiratory effort, no problems with respiration noted  Abdomen: Soft, gravid, appropriate for gestational age.  Pain/Pressure: Absent     Pelvic: Cervical exam deferred        Extremities: Normal range of motion.  Edema: None  Mental Status: Normal mood and affect. Normal behavior. Normal judgment and thought content.   Assessment and Plan:  Pregnancy: G3P1011 at [redacted]w[redacted]d 1. Supervision of high risk pregnancy in third trimester - Doing well, feeling regular and vigorous fetal movement   2. [redacted] weeks gestation of pregnancy - Routine OB care   3. Gestational diabetes mellitus (GDM) affecting pregnancy, antepartum - Most of her last post-prandials are elevated but >130 but pt states she is not waiting a full 2hrs to  take them. - ALL fastings are elevated but less so than prior. Increased her metformin to 1000mg  nightly  4. History of pre-eclampsia in prior pregnancy, currently pregnant - BP normal today, will add P:Cr for baseline level - Protein / creatinine ratio, urine  Preterm labor symptoms and general obstetric precautions including but not limited to vaginal bleeding, contractions, leaking of fluid and fetal movement were reviewed in detail with the patient. Please refer to After Visit Summary for other counseling recommendations.   Return in about 2 weeks (around 01/20/2022) for IN-PERSON, HOB.  Future Appointments  Date Time Provider Department Center  01/15/2022 10:30 AM [facility] NURSE [facility]  01/15/2022 10:45 AM [facility] US4 [facility]  01/20/2022  9:35 AM 01/22/2022, CNM [facility]    [facility] CNM

## 2022-01-07 LAB — PROTEIN / CREATININE RATIO, URINE
Creatinine, Urine: 133 mg/dL
Protein, Ur: 24.8 mg/dL
Protein/Creat Ratio: 186 mg/g creat (ref 0–200)

## 2022-01-15 ENCOUNTER — Other Ambulatory Visit: Payer: Self-pay | Admitting: Obstetrics

## 2022-01-15 ENCOUNTER — Ambulatory Visit: Payer: Medicaid Other | Attending: Obstetrics

## 2022-01-15 ENCOUNTER — Other Ambulatory Visit: Payer: Self-pay | Admitting: *Deleted

## 2022-01-15 ENCOUNTER — Ambulatory Visit: Payer: Medicaid Other | Admitting: *Deleted

## 2022-01-15 VITALS — BP 122/68 | HR 96

## 2022-01-15 DIAGNOSIS — O24419 Gestational diabetes mellitus in pregnancy, unspecified control: Secondary | ICD-10-CM | POA: Insufficient documentation

## 2022-01-15 DIAGNOSIS — O99213 Obesity complicating pregnancy, third trimester: Secondary | ICD-10-CM | POA: Insufficient documentation

## 2022-01-15 DIAGNOSIS — O09293 Supervision of pregnancy with other poor reproductive or obstetric history, third trimester: Secondary | ICD-10-CM

## 2022-01-15 DIAGNOSIS — E669 Obesity, unspecified: Secondary | ICD-10-CM

## 2022-01-15 DIAGNOSIS — O24415 Gestational diabetes mellitus in pregnancy, controlled by oral hypoglycemic drugs: Secondary | ICD-10-CM

## 2022-01-15 DIAGNOSIS — O09299 Supervision of pregnancy with other poor reproductive or obstetric history, unspecified trimester: Secondary | ICD-10-CM | POA: Insufficient documentation

## 2022-01-15 DIAGNOSIS — Z8632 Personal history of gestational diabetes: Secondary | ICD-10-CM | POA: Diagnosis present

## 2022-01-15 DIAGNOSIS — O0933 Supervision of pregnancy with insufficient antenatal care, third trimester: Secondary | ICD-10-CM

## 2022-01-15 DIAGNOSIS — O0993 Supervision of high risk pregnancy, unspecified, third trimester: Secondary | ICD-10-CM | POA: Diagnosis present

## 2022-01-15 DIAGNOSIS — Z3A32 32 weeks gestation of pregnancy: Secondary | ICD-10-CM

## 2022-01-20 ENCOUNTER — Ambulatory Visit (INDEPENDENT_AMBULATORY_CARE_PROVIDER_SITE_OTHER): Payer: Self-pay

## 2022-01-20 ENCOUNTER — Other Ambulatory Visit: Payer: Self-pay

## 2022-01-20 ENCOUNTER — Ambulatory Visit (INDEPENDENT_AMBULATORY_CARE_PROVIDER_SITE_OTHER): Payer: Self-pay | Admitting: Certified Nurse Midwife

## 2022-01-20 ENCOUNTER — Ambulatory Visit: Payer: Self-pay | Admitting: *Deleted

## 2022-01-20 VITALS — BP 131/81 | HR 94 | Wt 180.1 lb

## 2022-01-20 DIAGNOSIS — O24414 Gestational diabetes mellitus in pregnancy, insulin controlled: Secondary | ICD-10-CM

## 2022-01-20 DIAGNOSIS — O0993 Supervision of high risk pregnancy, unspecified, third trimester: Secondary | ICD-10-CM

## 2022-01-20 DIAGNOSIS — Z3A33 33 weeks gestation of pregnancy: Secondary | ICD-10-CM

## 2022-01-20 DIAGNOSIS — O24415 Gestational diabetes mellitus in pregnancy, controlled by oral hypoglycemic drugs: Secondary | ICD-10-CM

## 2022-01-20 DIAGNOSIS — O09293 Supervision of pregnancy with other poor reproductive or obstetric history, third trimester: Secondary | ICD-10-CM

## 2022-01-20 DIAGNOSIS — O09299 Supervision of pregnancy with other poor reproductive or obstetric history, unspecified trimester: Secondary | ICD-10-CM

## 2022-01-20 MED ORDER — "INSULIN SYRINGE-NEEDLE U-100 31G X 5/16"" 0.3 ML MISC": 1.0000 | Freq: Two times a day (BID) | 3 refills | Status: DC

## 2022-01-20 MED ORDER — INSULIN NPH (HUMAN) (ISOPHANE) 100 UNIT/ML ~~LOC~~ SUSP: 5.0000 [IU] | Freq: Two times a day (BID) | SUBCUTANEOUS | 3 refills | Status: DC

## 2022-01-20 NOTE — Progress Notes (Signed)
   PRENATAL VISIT NOTE  Subjective:  Julie Ellison is a 26 y.o. G3P1011 at [redacted]w[redacted]d being seen today for ongoing prenatal care.  She is currently monitored for the following issues for this high-risk pregnancy and has History of pre-eclampsia in prior pregnancy, currently pregnant; Gestational diabetes; Supervision of high-risk pregnancy; History of ectopic pregnancy; and Proteinuria on their problem list.  Patient reports no complaints.  Contractions: Not present. Vag. Bleeding: None.  Movement: Present. Denies leaking of fluid.   The following portions of the patient's history were reviewed and updated as appropriate: allergies, current medications, past family history, past medical history, past social history, past surgical history and problem list.   Objective:   Vitals:   01/20/22 1024  BP: 131/81  Pulse: 94  Weight: 180 lb 1.6 oz (81.7 kg)    Fetal Status: Fetal Heart Rate (bpm): 148   Movement: Present     General:  Alert, oriented and cooperative. Patient is in no acute distress.  Skin: Skin is warm and dry. No rash noted.   Cardiovascular: Normal heart rate noted  Respiratory: Normal respiratory effort, no problems with respiration noted  Abdomen: Soft, gravid, appropriate for gestational age.  Pain/Pressure: Present     Pelvic: Cervical exam deferred        Extremities: Normal range of motion.  Edema: None  Mental Status: Normal mood and affect. Normal behavior. Normal judgment and thought content.   Assessment and Plan:  Pregnancy: G3P1011 at [redacted]w[redacted]d 1. Supervision of high risk pregnancy in third trimester - Doing well, feeling regular and vigorous fetal movement   2. [redacted] weeks gestation of pregnancy - Routine OB care   3. History of pre-eclampsia in prior pregnancy, currently pregnant - BP stable, no s/sx PEC  4. Insulin controlled gestational diabetes mellitus (GDM) in third trimester - Glucose log shows much tighter control of post-prandial values but  fasting values still all out of range (although less so). Now taking 1500mg  metformin daily (1000mg  at night). - Reviewed glucose log with Dr. who recommended BID dosing of NPH for tighter control of glucose. Discussed with pt who is amenable to starting insulin. Basic review of how to inject but also put in request to get her back in with RD. - Insulin Syringe-Needle U-100 (RELION INSULIN SYR 0.3ML/31G) 31G X 5/16" 0.3 ML MISC; 1 each by Does not apply route 2 (two) times daily. After breakfast and dinner  Dispense: 100 each; Refill: 3 - insulin NPH Human (NOVOLIN N) 100 UNIT/ML injection; Inject 0.05 mLs (5 Units total) into the skin 2 (two) times daily before a meal.  Dispense: 10 mL; Refill: 3  Preterm labor symptoms and general obstetric precautions including but not limited to vaginal bleeding, contractions, leaking of fluid and fetal movement were reviewed in detail with the patient. Please refer to After Visit Summary for other counseling recommendations.   Return in about 2 weeks (around 02/03/2022) for IN-PERSON, HOB.  Future Appointments  Date Time Provider Department Center  01/20/2022 11:15 AM Day, 04/04/2022 RN [facility]  01/20/2022 12:15 PM [facility] US1 [facility]  01/28/2022  9:15 AM [facility] NST [facility]  02/03/2022  2:15 PM [facility] NST [facility]  02/03/2022  3:55 PM [facility] MD [facility]  02/15/2022  8:30 AM [facility] NURSE [facility]  02/15/2022  8:45 AM [facility] US5 [facility]    [facility] CNM

## 2022-01-21 ENCOUNTER — Other Ambulatory Visit: Payer: Self-pay

## 2022-01-28 ENCOUNTER — Other Ambulatory Visit: Payer: Self-pay

## 2022-01-28 ENCOUNTER — Telehealth: Payer: Self-pay | Admitting: Family Medicine

## 2022-01-28 NOTE — Telephone Encounter (Signed)
Called patient to inform of nst cancellation. There was no answer to the phone call so a voicemail was left with the call back number for the office. 

## 2022-02-01 NOTE — L&D Delivery Note (Signed)
Delivery Note:   Julie Ellison [id number] at [redacted]w[redacted]d  Admitting diagnosis: Uterine contractions during pregnancy [O47.9] Risks: A2DM  First Stage:  Induction of labor:no Onset of labor: 2200 Augmentation: AROM ROM: 0630 Active labor onset: 0400 Analgesia /Anesthesia/Pain control intrapartum: Epidural   Second Stage:  Complete dilation at 02/19/2022  0740 Onset of pushing at 0743 FHR second stage 15 minutes   Pushing in llithotomy position with CNM and L&D staff support, family present for birth and supportive. Nuchal Cord: Yes  Delivery of a Live born female  Birth Weight:   APGAR: 9, 9  Newborn Delivery   Birth date/time: 02/19/2022 07:49:00 Delivery type: Vaginal, Spontaneous     APGAR: 10 min-    Infant delivered in cephalic presentation, in ROA position and restituted to ROA position.  Cord double clamped after 1 minute, cut by FOB.  Collection of cord blood for typing completed. Cord blood donation-None  Arterial cord blood sample-No    Third Stage:  Placenta delivered-Spontaneous;Expressed  with 3 vessels . Uterine tone firm bleeding minimal 20u Pitocin in 500cc LR given as a bolus prior delivery of placenta Uterotonics: none Placenta to l&d.  None  laceration identified.  Episiotomy:None  Local analgesia: none  Repair:n/a Est. Blood Loss (GG):26.94   Complications: None   Mom to postpartum.  Baby girl to Couplet care / Skin to Skin.  Delivery Report:  Review the Delivery Report for details.     Signed: Christin Fudge, DNP,CNM 02/19/2022, 8:04 AM

## 2022-02-02 ENCOUNTER — Other Ambulatory Visit: Payer: Self-pay

## 2022-02-03 ENCOUNTER — Other Ambulatory Visit: Payer: Self-pay

## 2022-02-03 ENCOUNTER — Ambulatory Visit: Payer: 59 | Admitting: General Practice

## 2022-02-03 ENCOUNTER — Ambulatory Visit (INDEPENDENT_AMBULATORY_CARE_PROVIDER_SITE_OTHER): Payer: 59

## 2022-02-03 ENCOUNTER — Ambulatory Visit (INDEPENDENT_AMBULATORY_CARE_PROVIDER_SITE_OTHER): Payer: 59 | Admitting: Obstetrics & Gynecology

## 2022-02-03 VITALS — BP 121/78 | HR 103 | Wt 180.0 lb

## 2022-02-03 DIAGNOSIS — Z3A35 35 weeks gestation of pregnancy: Secondary | ICD-10-CM

## 2022-02-03 DIAGNOSIS — O09299 Supervision of pregnancy with other poor reproductive or obstetric history, unspecified trimester: Secondary | ICD-10-CM

## 2022-02-03 DIAGNOSIS — O24414 Gestational diabetes mellitus in pregnancy, insulin controlled: Secondary | ICD-10-CM

## 2022-02-03 DIAGNOSIS — Z8759 Personal history of other complications of pregnancy, childbirth and the puerperium: Secondary | ICD-10-CM

## 2022-02-03 DIAGNOSIS — O0993 Supervision of high risk pregnancy, unspecified, third trimester: Secondary | ICD-10-CM

## 2022-02-03 DIAGNOSIS — O09293 Supervision of pregnancy with other poor reproductive or obstetric history, third trimester: Secondary | ICD-10-CM

## 2022-02-03 NOTE — Progress Notes (Signed)
   PRENATAL VISIT NOTE  Subjective:  Julie Ellison is a 27 y.o. G3P1011 at [redacted]w[redacted]d being seen today for ongoing prenatal care.  She is currently monitored for the following issues for this high-risk pregnancy and has History of pre-eclampsia in prior pregnancy, currently pregnant; Gestational diabetes; Supervision of high-risk pregnancy; History of ectopic pregnancy; and Proteinuria on their problem list.  Patient reports no complaints.  Contractions: Not present.  .  Movement: Present. Denies leaking of fluid.   The following portions of the patient's history were reviewed and updated as appropriate: allergies, current medications, past family history, past medical history, past social history, past surgical history and problem list.   Objective:   Vitals:   02/03/22 1440  BP: 121/78  Pulse: (!) 103  Weight: 180 lb (81.6 kg)    Fetal Status: Fetal Heart Rate (bpm): NST   Movement: Present     General:  Alert, oriented and cooperative. Patient is in no acute distress.  Skin: Skin is warm and dry. No rash noted.   Cardiovascular: Normal heart rate noted  Respiratory: Normal respiratory effort, no problems with respiration noted  Abdomen: Soft, gravid, appropriate for gestational age.  Pain/Pressure: Present     Pelvic: Cervical exam deferred        Extremities: Normal range of motion.  Edema: None  Mental Status: Normal mood and affect. Normal behavior. Normal judgment and thought content.   Assessment and Plan:  Pregnancy: G3P1011 at [redacted]w[redacted]d 1. Insulin controlled gestational diabetes mellitus (GDM) in third trimester Reviewed BG see note no change in insulin  2. Supervision of high risk pregnancy in third trimester BPP 10/10 but decreased AFI 6 cm Repeat in 1 week 3. History of ectopic pregnancy   4. History of pre-eclampsia in prior pregnancy, currently pregnant Nl BP  Preterm labor symptoms and general obstetric precautions including but not limited to vaginal  bleeding, contractions, leaking of fluid and fetal movement were reviewed in detail with the patient. Please refer to After Visit Summary for other counseling recommendations.   Return in about 1 week (around 02/10/2022).  Future Appointments  Date Time Provider [place]  02/11/2022  3:15 PM [facility]  02/11/2022  4:15 PM Marcy Salvo [facility]  02/15/2022  8:30 AM [facility] NURSE [facility]  02/15/2022  8:45 AM [facility] US5 [facility]  02/17/2022  3:15 PM [facility] NST [facility]  02/17/2022  4:15 PM Woodroe Mode, MD [facility]  02/25/2022  8:15 AM Gabriel Carina, CNM [facility]  02/25/2022 11:15 AM [facility] NST [facility]    Emeterio Reeve, MD

## 2022-02-03 NOTE — Progress Notes (Signed)
Pt informed that the ultrasound is considered a limited OB ultrasound and is not intended to be a complete ultrasound exam.  Patient also informed that the ultrasound is not being completed with the intent of assessing for fetal or placental anomalies or any pelvic abnormalities.  Explained that the purpose of today's ultrasound is to assess for  BPP, presentation, and AFI.  Patient acknowledges the purpose of the exam and the limitations of the study.     Keierra Nudo H RN BSN 02/03/22  

## 2022-02-07 ENCOUNTER — Encounter (HOSPITAL_COMMUNITY): Payer: Self-pay | Admitting: Obstetrics & Gynecology

## 2022-02-07 ENCOUNTER — Other Ambulatory Visit: Payer: Self-pay

## 2022-02-07 ENCOUNTER — Inpatient Hospital Stay (HOSPITAL_COMMUNITY)
Admission: AD | Admit: 2022-02-07 | Discharge: 2022-02-07 | Disposition: A | Discharge status: Home / Self Care | Payer: 59 | Attending: Obstetrics & Gynecology | Admitting: Obstetrics & Gynecology

## 2022-02-07 DIAGNOSIS — O4103X0 Oligohydramnios, third trimester, not applicable or unspecified: Secondary | ICD-10-CM | POA: Diagnosis not present

## 2022-02-07 DIAGNOSIS — Z7984 Long term (current) use of oral hypoglycemic drugs: Secondary | ICD-10-CM | POA: Insufficient documentation

## 2022-02-07 DIAGNOSIS — Z3A35 35 weeks gestation of pregnancy: Secondary | ICD-10-CM | POA: Diagnosis not present

## 2022-02-07 DIAGNOSIS — Z794 Long term (current) use of insulin: Secondary | ICD-10-CM | POA: Insufficient documentation

## 2022-02-07 DIAGNOSIS — O24113 Pre-existing diabetes mellitus, type 2, in pregnancy, third trimester: Secondary | ICD-10-CM | POA: Insufficient documentation

## 2022-02-07 DIAGNOSIS — Z7982 Long term (current) use of aspirin: Secondary | ICD-10-CM | POA: Diagnosis not present

## 2022-02-07 DIAGNOSIS — O09293 Supervision of pregnancy with other poor reproductive or obstetric history, third trimester: Secondary | ICD-10-CM | POA: Diagnosis not present

## 2022-02-07 DIAGNOSIS — O368130 Decreased fetal movements, third trimester, not applicable or unspecified: Secondary | ICD-10-CM | POA: Insufficient documentation

## 2022-02-07 NOTE — MAU Provider Note (Signed)
MAU Provider Note  History  [id number]  Arrival date and time: 02/07/22 1529   Chief Complaint  Patient presents with   Decreased Fetal Movement     HPI Julie Ellison is a 27 y.o. G3P1011 at [redacted]w[redacted]d by [place] with PMHx notable for A2GDM, Decreased AFI, who presents for decreased fetal movement since this morning.  She did feel baby move about 45 minutes before arrival.  She took a cold drink this morning but did not count how many movements she felt, just that baby was moving less than typical.  She had a recent ultrasound on 1/3 which noted a decreased AFI of 6 cm.  She will have another ultrasound this coming week.  She also notes pelvic pressure, but would not describe this as contractions.  No loss of fluid.  From chart review, patient has anterior placenta.  Vaginal bleeding: No LOF: No Fetal Movement: Yes Contractions: No  O/Positive/-- (10/17 0954)  OB History     Gravida  3   Para  1   Term  1   Preterm      AB  1   Living  1      SAB      IAB      Ectopic  1   Multiple      Live Births  1           Past Medical History:  Diagnosis Date   Diabetes mellitus without complication ([place])    Gestational diabetes    History of ectopic pregnancy 11/04/2021   Managed surgically, R salpingectomy   History of gestational diabetes in prior pregnancy, currently pregnant 11/03/2021   History of pre-eclampsia in prior pregnancy, currently pregnant 09/24/2019   [x]  ASA [ ] Baseline P:C ratio    Latest Ref Rng & Units 07/27/2021   12:57 PM Glucose 70 - 99 mg/dL 98  BUN 6 - 20 mg/dL 6  Creatinine 0.44 - 1.00 mg/dL 0.52  Sodium 135 - 145 mmol/L 136  Potassium 3.5 - 5.1 mmol/L 3.4  Chloride 98 - 111 mmol/L 104  CO2 22 - 32 mmol/L 22  Calcium 8.9 - 10.3 mg/dL 8.6  Total Protein 6.5 - 8.1 g/dL 7.0  Total Bilirubin 0.3 - 1.2 mg/dL 0.4  Alkaline Phos 38 - 126 U/L    Hx of gonorrhea 2017    Past Surgical History:  Procedure Laterality Date   DIAGNOSTIC LAPAROSCOPY  WITH REMOVAL OF ECTOPIC PREGNANCY N/A 11/03/2017   Procedure: DIAGNOSTIC LAPAROSCOPY, Right Salpingectomy with removal ECTOPIC PREGNANCY;  Surgeon: Lavonia Drafts, MD;  Location: [place] [facility];  Service: Gynecology;  Laterality: N/A;    Family History  Problem Relation Age of Onset   Hypertension Maternal Grandfather    Asthma Neg Hx    Diabetes Neg Hx    Heart disease Neg Hx    Stroke Neg Hx     Social History   Socioeconomic History   Marital status: Married    Spouse name: Taurino   Number of children: Not on file   Years of education: Not on file   Highest education level: High school graduate  Occupational History   Not on file  Tobacco Use   Smoking status: Former   Smokeless tobacco: Never  Scientific laboratory technician Use: Never used  Substance and Sexual Activity   Alcohol use: Not Currently   Drug use: Never   Sexual activity: Yes    Birth control/protection: None  Other Topics Concern   Not on  file  Social History Narrative   Not on file   Social Determinants of Health   Financial Resource Strain: Not on file  Food Insecurity: Not on file  Transportation Needs: Not on file  Physical Activity: Not on file  Stress: Not on file  Social Connections: Not on file  Intimate Partner Violence: Not on file    No Known Allergies  No current facility-administered medications on file prior to encounter.   Current Outpatient Medications on File Prior to Encounter  Medication Sig Dispense Refill   insulin NPH Human (NOVOLIN N) 100 UNIT/ML injection Inject 0.05 mLs (5 Units total) into the skin 2 (two) times daily before a meal. 10 mL 3   metFORMIN (GLUCOPHAGE) 500 MG tablet Take 2 tablets (1,000 mg total) by mouth daily after supper. 60 tablet 2   Accu-Chek Softclix Lancets lancets Use 4 times daily as instructed 100 each 12   aspirin EC 81 MG tablet Take 1 tablet (81 mg total) by mouth daily. Take after 12 weeks for prevention of preeclampssia later in pregnancy  (Patient not taking: Reported on 02/03/2022) 300 tablet 2   Blood Glucose Monitoring Suppl (ACCU-CHEK GUIDE) w/Device KIT Use 4 times daily as instructed 1 kit 0   glucose blood (ACCU-CHEK GUIDE) test strip Use 4 times daily as instructed 100 each 12   Insulin Syringe-Needle U-100 (RELION INSULIN SYR 0.3ML/31G) 31G X 5/16" 0.3 ML MISC 1 each by Does not apply route 2 (two) times daily. After breakfast and dinner 100 each 3   Prenatal Vit-Fe Fumarate-FA (PRENATAL VITAMIN PO) Take 1 tablet by mouth daily.       Review of Systems  Pertinent positives and negative per HPI, all others reviewed and negative  Physical Exam   BP 125/80 (BP Location: Right Arm)   Pulse (!) 116   Temp 98.5 F (36.9 C) (Oral)   Resp 17   LMP 05/24/2021 (Approximate)   SpO2 99%   Patient Vitals for the past 24 hrs:  BP Temp Temp src Pulse Resp SpO2  02/07/22 1549 125/80 98.5 F (36.9 C) Oral (!) 116 17 99 %    Physical Exam  Cervical Exam  Not done  Bedside Ultrasound done Pt informed that the ultrasound is considered a limited OB ultrasound and is not intended to be a complete ultrasound exam.  Patient also informed that the ultrasound is not being completed with the intent of assessing for fetal or placental anomalies or any pelvic abnormalities.  Explained that the purpose of today's ultrasound is to assess for  viability.  Patient acknowledges the purpose of the exam and the limitations of the study.   Findings: Positive fetal limb movement, cardiac activity 154 bpm, oligohydramnios, anterior placenta  FHT Baseline 150 bpm,  moderate variability, 15 x 15 accels, none decels Toco: Inconsistent contractions Cat: 1  Labs No results found for this or any previous visit (from the past 24 hour(s)).  Imaging No results found.  MAU Course  Procedures Lab Orders  No laboratory test(s) ordered today   No orders of the defined types were placed in this encounter.  Imaging Orders  No imaging studies  ordered today    MDM moderate  Assessment and Plan  Decreased fetal movement [redacted] weeks gestation Fetal Well being As above Discussed patient with RN. NST reviewed.   Patient here for decreased fetal movements.  She tried to drink a cold beverage this morning to get baby moving, she noted that she felt 1 movement  in the last 45 minutes prior to arrival.  On bedside ultrasound, noted fetal limb movements, FHT 154 beats per minutes.  NST with moderate variability, no decelerations, 15 x 15 accelerations and, no consistent contractions (were initially 3 to 5 minutes apart, but now have spaced out).  Counseled on fetal kick counts.  Patient has follow-up ultrasound this week.  Gave return and preterm labor precautions.  Follow-up with primary OB.  Dispo: discharged to home in stable condition.   Discharge Instructions     Discharge patient   Complete by: As directed    Discharge disposition: 01-Home or Self Care   Discharge patient date: 02/07/2022      Allergies as of 02/07/2022   No Known Allergies      Medication List     TAKE these medications    Accu-Chek Guide test strip Generic drug: glucose blood Use 4 times daily as instructed   Accu-Chek Guide w/Device Kit Use 4 times daily as instructed   Accu-Chek Softclix Lancets lancets Use 4 times daily as instructed   aspirin EC 81 MG tablet Take 1 tablet (81 mg total) by mouth daily. Take after 12 weeks for prevention of preeclampssia later in pregnancy   insulin NPH Human 100 UNIT/ML injection Commonly known as: NOVOLIN N Inject 0.05 mLs (5 Units total) into the skin 2 (two) times daily before a meal.   Insulin Syringe-Needle U-100 31G X 5/16" 0.3 ML Misc Commonly known as: RELION INSULIN SYR 0.3ML/31G 1 each by Does not apply route 2 (two) times daily. After breakfast and dinner   metFORMIN 500 MG tablet Commonly known as: GLUCOPHAGE Take 2 tablets (1,000 mg total) by mouth daily after supper.   PRENATAL  VITAMIN PO Take 1 tablet by mouth daily.        Myrtie Hawk, DO FMOB Fellow, Faculty practice [facility] 02/07/22  4:36 PM

## 2022-02-07 NOTE — MAU Note (Signed)
.  Julie Ellison is a 27 y.o. at [redacted]w[redacted]d here in MAU reporting: DFM since waking up today.  Reports less movement than normal.  Last felt a single movement 45 min ago. Denies reg ctx, lof or vag bleeding   Pain score: 0 Vitals:   02/07/22 1549  BP: 125/80  Pulse: (!) 116  Resp: 17  Temp: 98.5 F (36.9 C)  SpO2: 99%     FHT:150 Lab orders placed from triage:

## 2022-02-11 ENCOUNTER — Other Ambulatory Visit: Payer: Self-pay

## 2022-02-11 ENCOUNTER — Ambulatory Visit: Payer: 59 | Admitting: *Deleted

## 2022-02-11 ENCOUNTER — Ambulatory Visit (INDEPENDENT_AMBULATORY_CARE_PROVIDER_SITE_OTHER): Payer: 59 | Admitting: Family Medicine

## 2022-02-11 ENCOUNTER — Encounter: Payer: Self-pay | Admitting: General Practice

## 2022-02-11 ENCOUNTER — Ambulatory Visit (INDEPENDENT_AMBULATORY_CARE_PROVIDER_SITE_OTHER): Payer: 59

## 2022-02-11 ENCOUNTER — Other Ambulatory Visit (HOSPITAL_COMMUNITY)
Admission: RE | Admit: 2022-02-11 | Discharge: 2022-02-11 | Disposition: A | Discharge status: Home / Self Care | Payer: 59 | Source: Ambulatory Visit | Attending: Family Medicine | Admitting: Family Medicine

## 2022-02-11 VITALS — BP 123/75 | HR 108 | Wt 180.7 lb

## 2022-02-11 DIAGNOSIS — Z3A36 36 weeks gestation of pregnancy: Secondary | ICD-10-CM

## 2022-02-11 DIAGNOSIS — O09299 Supervision of pregnancy with other poor reproductive or obstetric history, unspecified trimester: Secondary | ICD-10-CM

## 2022-02-11 DIAGNOSIS — O0993 Supervision of high risk pregnancy, unspecified, third trimester: Secondary | ICD-10-CM | POA: Diagnosis not present

## 2022-02-11 DIAGNOSIS — O24414 Gestational diabetes mellitus in pregnancy, insulin controlled: Secondary | ICD-10-CM

## 2022-02-11 MED ORDER — INSULIN NPH (HUMAN) (ISOPHANE) 100 UNIT/ML ~~LOC~~ SUSP: 7.0000 [IU] | Freq: Two times a day (BID) | SUBCUTANEOUS | 0 refills | Status: DC

## 2022-02-11 NOTE — Progress Notes (Signed)
   PRENATAL VISIT NOTE  Subjective:  Julie Ellison is a 27 y.o. G3P1011 at [redacted]w[redacted]d being seen today for ongoing prenatal care.  She is currently monitored for the following issues for this high-risk pregnancy and has History of pre-eclampsia in prior pregnancy, currently pregnant; Gestational diabetes; Supervision of high-risk pregnancy; History of ectopic pregnancy; and Proteinuria on their problem list.  Patient reports no complaints.  Contractions: Not present. Vag. Bleeding: None.  Movement: Present. Denies leaking of fluid.   The following portions of the patient's history were reviewed and updated as appropriate: allergies, current medications, past family history, past medical history, past social history, past surgical history and problem list.   Objective:   Vitals:   02/11/22 1549  BP: 123/75  Pulse: (!) 108  Weight: 180 lb 11.2 oz (82 kg)    Fetal Status:     Movement: Present     General:  Alert, oriented and cooperative. Patient is in no acute distress.  Skin: Skin is warm and dry. No rash noted.   Cardiovascular: Normal heart rate noted  Respiratory: Normal respiratory effort, no problems with respiration noted  Abdomen: Soft, gravid, appropriate for gestational age.  Pain/Pressure: Present     Pelvic: Cervical exam performed in the presence of a chaperone Dilation: 3 Effacement (%): 50 Station: -2  Extremities: Normal range of motion.     Mental Status: Normal mood and affect. Normal behavior. Normal judgment and thought content.   Assessment and Plan:  Pregnancy: G3P1011 at [redacted]w[redacted]d 1. Supervision of high risk pregnancy in third trimester - Strep Gp B NAA - Cervicovaginal ancillary only  2. [redacted] weeks gestation of pregnancy Contracting on BPP today reported to be every 2-3 mins, 3cm dilated. Non painful to paitent. MAU precautions given, otherwise 1 week F/U   3. Insulin controlled gestational diabetes mellitus (GDM) in third trimester Some fasting and  postparandial slightly above goal. BPP 10/10. MFM f/u sched 1/15 -Continue metformin, increase insulin dose by 2 units BID - insulin NPH Human (NOVOLIN N) 100 UNIT/ML injection; Inject 0.07 mLs (7 Units total) into the skin 2 (two) times daily before a meal.  Dispense: 10 mL; Refill: 0  4. History of pre-eclampsia in prior pregnancy, currently pregnant BP wnl today.    Preterm labor symptoms and general obstetric precautions including but not limited to vaginal bleeding, contractions, leaking of fluid and fetal movement were reviewed in detail with the patient. Please refer to After Visit Summary for other counseling recommendations.   Return in about 6 days (around 02/17/2022) for [facility] as scheduled.  Future Appointments  Date Time Provider [place]  02/15/2022  8:30 AM [facility] NURSE [facility]  02/15/2022  8:45 AM [facility] US5 [facility]  02/17/2022  4:15 PM Woodroe Mode, MD [facility]  02/25/2022  8:15 AM Gabriel Carina, CNM [facility]  02/25/2022 11:15 AM [facility] NST [facility]    Maansi Wike Autry-Lott, DO

## 2022-02-11 NOTE — Progress Notes (Signed)
Pt had MAU visit on 1/7 d/t decreased FM.  States FM has improved.  [place] for growth & BPP @ MFM on 1/15. Pt is scheduled for weekly fetal testing

## 2022-02-11 NOTE — Progress Notes (Signed)

## 2022-02-12 LAB — CERVICOVAGINAL ANCILLARY ONLY
Chlamydia: NEGATIVE
Comment: NEGATIVE
Comment: NORMAL
Neisseria Gonorrhea: NEGATIVE

## 2022-02-13 LAB — STREP GP B NAA: Strep Gp B NAA: NEGATIVE

## 2022-02-15 ENCOUNTER — Ambulatory Visit: Payer: 59 | Admitting: *Deleted

## 2022-02-15 ENCOUNTER — Ambulatory Visit: Payer: 59 | Attending: Obstetrics and Gynecology

## 2022-02-15 VITALS — BP 122/72 | HR 84

## 2022-02-15 DIAGNOSIS — O09293 Supervision of pregnancy with other poor reproductive or obstetric history, third trimester: Secondary | ICD-10-CM

## 2022-02-15 DIAGNOSIS — O24414 Gestational diabetes mellitus in pregnancy, insulin controlled: Secondary | ICD-10-CM

## 2022-02-15 DIAGNOSIS — O0993 Supervision of high risk pregnancy, unspecified, third trimester: Secondary | ICD-10-CM | POA: Diagnosis not present

## 2022-02-15 DIAGNOSIS — E669 Obesity, unspecified: Secondary | ICD-10-CM | POA: Diagnosis not present

## 2022-02-15 DIAGNOSIS — Z3A36 36 weeks gestation of pregnancy: Secondary | ICD-10-CM

## 2022-02-15 DIAGNOSIS — O24415 Gestational diabetes mellitus in pregnancy, controlled by oral hypoglycemic drugs: Secondary | ICD-10-CM | POA: Insufficient documentation

## 2022-02-15 DIAGNOSIS — O99213 Obesity complicating pregnancy, third trimester: Secondary | ICD-10-CM

## 2022-02-17 ENCOUNTER — Encounter: Payer: Self-pay | Admitting: Obstetrics & Gynecology

## 2022-02-17 ENCOUNTER — Other Ambulatory Visit: Payer: Self-pay

## 2022-02-17 ENCOUNTER — Ambulatory Visit (INDEPENDENT_AMBULATORY_CARE_PROVIDER_SITE_OTHER): Payer: 59 | Admitting: Obstetrics & Gynecology

## 2022-02-17 VITALS — BP 118/73 | HR 107 | Wt 184.2 lb

## 2022-02-17 DIAGNOSIS — O0993 Supervision of high risk pregnancy, unspecified, third trimester: Secondary | ICD-10-CM

## 2022-02-17 DIAGNOSIS — O09299 Supervision of pregnancy with other poor reproductive or obstetric history, unspecified trimester: Secondary | ICD-10-CM

## 2022-02-17 DIAGNOSIS — Z3483 Encounter for supervision of other normal pregnancy, third trimester: Secondary | ICD-10-CM | POA: Diagnosis not present

## 2022-02-17 DIAGNOSIS — O09293 Supervision of pregnancy with other poor reproductive or obstetric history, third trimester: Secondary | ICD-10-CM

## 2022-02-17 DIAGNOSIS — Z3482 Encounter for supervision of other normal pregnancy, second trimester: Secondary | ICD-10-CM | POA: Diagnosis not present

## 2022-02-17 DIAGNOSIS — Z3A37 37 weeks gestation of pregnancy: Secondary | ICD-10-CM

## 2022-02-17 DIAGNOSIS — O24414 Gestational diabetes mellitus in pregnancy, insulin controlled: Secondary | ICD-10-CM

## 2022-02-17 NOTE — Progress Notes (Signed)
   PRENATAL VISIT NOTE  Subjective:  Julie Ellison is a 27 y.o. G3P1011 at [redacted]w[redacted]d being seen today for ongoing prenatal care.  She is currently monitored for the following issues for this high-risk pregnancy and has History of pre-eclampsia in prior pregnancy, currently pregnant; Gestational diabetes; Supervision of high-risk pregnancy; History of ectopic pregnancy; and Proteinuria on their problem list.  Patient reports  mucus discharge .  Contractions: Irritability. Vag. Bleeding: None.  Movement: Present. Denies leaking of fluid.   The following portions of the patient's history were reviewed and updated as appropriate: allergies, current medications, past family history, past medical history, past social history, past surgical history and problem list.   Objective:   Vitals:   02/17/22 1559  BP: 118/73  Pulse: (!) 107  Weight: 184 lb 3.2 oz (83.6 kg)    Fetal Status: Fetal Heart Rate (bpm): 152   Movement: Present     General:  Alert, oriented and cooperative. Patient is in no acute distress.  Skin: Skin is warm and dry. No rash noted.   Cardiovascular: Normal heart rate noted  Respiratory: Normal respiratory effort, no problems with respiration noted  Abdomen: Soft, gravid, appropriate for gestational age.  Pain/Pressure: Absent     Pelvic: Cervical exam performed in the presence of a chaperone Dilation: 3 Effacement (%): 50 Station: -3  mucus noted in vagina no ROM  Extremities: Normal range of motion.  Edema: None  Mental Status: Normal mood and affect. Normal behavior. Normal judgment and thought content.   Assessment and Plan:  Pregnancy: G3P1011 at [redacted]w[redacted]d 1. Insulin controlled gestational diabetes mellitus (GDM) in third trimester Good control after insulin was adjusted last visit  2. Supervision of high risk pregnancy in third trimester Followed with weekly BPP  3. History of pre-eclampsia in prior pregnancy, currently pregnant BP nl today  Term labor  symptoms and general obstetric precautions including but not limited to vaginal bleeding, contractions, leaking of fluid and fetal movement were reviewed in detail with the patient. Please refer to After Visit Summary for other counseling recommendations.   Return in about 1 week (around 02/24/2022).  Future Appointments  Date Time Provider [place]  02/25/2022  8:15 AM Helaine Chess [facility]  02/25/2022 11:15 AM [facility] NST [facility]    Emeterio Reeve, MD

## 2022-02-19 ENCOUNTER — Inpatient Hospital Stay (HOSPITAL_COMMUNITY): Payer: 59 | Admitting: Anesthesiology

## 2022-02-19 ENCOUNTER — Encounter (HOSPITAL_COMMUNITY): Payer: Self-pay | Admitting: Obstetrics & Gynecology

## 2022-02-19 ENCOUNTER — Other Ambulatory Visit: Payer: Self-pay

## 2022-02-19 ENCOUNTER — Inpatient Hospital Stay (HOSPITAL_COMMUNITY)
Admission: AD | Admit: 2022-02-19 | Discharge: 2022-02-20 | DRG: 807 | Disposition: A | Discharge status: Home / Self Care | Payer: 59 | Attending: Obstetrics & Gynecology | Admitting: Obstetrics & Gynecology

## 2022-02-19 DIAGNOSIS — O26893 Other specified pregnancy related conditions, third trimester: Secondary | ICD-10-CM | POA: Diagnosis not present

## 2022-02-19 DIAGNOSIS — Z0542 Observation and evaluation of newborn for suspected metabolic condition ruled out: Secondary | ICD-10-CM | POA: Diagnosis not present

## 2022-02-19 DIAGNOSIS — O479 False labor, unspecified: Secondary | ICD-10-CM | POA: Diagnosis present

## 2022-02-19 DIAGNOSIS — Z23 Encounter for immunization: Secondary | ICD-10-CM | POA: Diagnosis not present

## 2022-02-19 DIAGNOSIS — Z87891 Personal history of nicotine dependence: Secondary | ICD-10-CM | POA: Diagnosis not present

## 2022-02-19 DIAGNOSIS — O24424 Gestational diabetes mellitus in childbirth, insulin controlled: Secondary | ICD-10-CM | POA: Diagnosis not present

## 2022-02-19 DIAGNOSIS — Z3A37 37 weeks gestation of pregnancy: Secondary | ICD-10-CM

## 2022-02-19 DIAGNOSIS — O24425 Gestational diabetes mellitus in childbirth, controlled by oral hypoglycemic drugs: Secondary | ICD-10-CM | POA: Diagnosis not present

## 2022-02-19 HISTORY — DX: Essential (primary) hypertension: I10

## 2022-02-19 LAB — CBC
HCT: 39.7 % (ref 36.0–46.0)
Hemoglobin: 13.3 g/dL (ref 12.0–15.0)
MCH: 28.4 pg (ref 26.0–34.0)
MCHC: 33.5 g/dL (ref 30.0–36.0)
MCV: 84.6 fL (ref 80.0–100.0)
Platelets: 244 10*3/uL (ref 150–400)
RBC: 4.69 MIL/uL (ref 3.87–5.11)
RDW: 12.7 % (ref 11.5–15.5)
WBC: 9.9 10*3/uL (ref 4.0–10.5)
nRBC: 0 % (ref 0.0–0.2)

## 2022-02-19 LAB — RPR: RPR Ser Ql: NONREACTIVE

## 2022-02-19 LAB — TYPE AND SCREEN
ABO/RH(D): O POS
Antibody Screen: NEGATIVE

## 2022-02-19 LAB — GLUCOSE, CAPILLARY
Glucose-Capillary: 108 mg/dL — ABNORMAL HIGH (ref 70–99)
Glucose-Capillary: 122 mg/dL — ABNORMAL HIGH (ref 70–99)

## 2022-02-19 MED ORDER — BISACODYL 10 MG RE SUPP: 10.0000 mg | Freq: Every day | RECTAL | Status: DC | PRN

## 2022-02-19 MED ORDER — OXYCODONE-ACETAMINOPHEN 5-325 MG PO TABS: 2.0000 | ORAL_TABLET | ORAL | Status: DC | PRN

## 2022-02-19 MED ORDER — LACTATED RINGERS IV SOLN: INTRAVENOUS | Status: DC

## 2022-02-19 MED ORDER — ONDANSETRON HCL 4 MG/2ML IJ SOLN: 4.0000 mg | INTRAMUSCULAR | Status: DC | PRN

## 2022-02-19 MED ORDER — LACTATED RINGERS IV SOLN
500.0000 mL | INTRAVENOUS | Status: DC | PRN
  Administered 2022-02-19: 1000 mL via INTRAVENOUS

## 2022-02-19 MED ORDER — ACETAMINOPHEN 325 MG PO TABS: 650.0000 mg | ORAL_TABLET | ORAL | Status: DC | PRN

## 2022-02-19 MED ORDER — BENZOCAINE-MENTHOL 20-0.5 % EX AERO: 1.0000 | INHALATION_SPRAY | CUTANEOUS | Status: DC | PRN

## 2022-02-19 MED ORDER — WITCH HAZEL-GLYCERIN EX PADS: 1.0000 | MEDICATED_PAD | CUTANEOUS | Status: DC | PRN

## 2022-02-19 MED ORDER — LACTATED RINGERS IV SOLN: 500.0000 mL | Freq: Once | INTRAVENOUS | Status: DC

## 2022-02-19 MED ORDER — FLEET ENEMA 7-19 GM/118ML RE ENEM: 1.0000 | ENEMA | Freq: Every day | RECTAL | Status: DC | PRN

## 2022-02-19 MED ORDER — IBUPROFEN 600 MG PO TABS
600.0000 mg | ORAL_TABLET | Freq: Four times a day (QID) | ORAL | Status: DC
  Administered 2022-02-19 – 2022-02-20 (×5): 600 mg via ORAL
  Filled 2022-02-19 (×5): qty 1

## 2022-02-19 MED ORDER — PHENYLEPHRINE 80 MCG/ML (10ML) SYRINGE FOR IV PUSH (FOR BLOOD PRESSURE SUPPORT): 80.0000 ug | PREFILLED_SYRINGE | INTRAVENOUS | Status: DC | PRN

## 2022-02-19 MED ORDER — EPHEDRINE 5 MG/ML INJ: 10.0000 mg | INTRAVENOUS | Status: DC | PRN

## 2022-02-19 MED ORDER — METHYLERGONOVINE MALEATE 0.2 MG/ML IJ SOLN: 0.2000 mg | INTRAMUSCULAR | Status: DC | PRN

## 2022-02-19 MED ORDER — LIDOCAINE HCL (PF) 1 % IJ SOLN
INTRAMUSCULAR | Status: DC | PRN
  Administered 2022-02-19: 3 mL via EPIDURAL
  Administered 2022-02-19: 5 mL via EPIDURAL

## 2022-02-19 MED ORDER — FERROUS SULFATE 325 (65 FE) MG PO TABS
325.0000 mg | ORAL_TABLET | ORAL | Status: DC
  Administered 2022-02-19: 325 mg via ORAL
  Filled 2022-02-19: qty 1

## 2022-02-19 MED ORDER — SOD CITRATE-CITRIC ACID 500-334 MG/5ML PO SOLN: 30.0000 mL | ORAL | Status: DC | PRN

## 2022-02-19 MED ORDER — PRENATAL MULTIVITAMIN CH
1.0000 | ORAL_TABLET | Freq: Every day | ORAL | Status: DC
  Administered 2022-02-19 – 2022-02-20 (×2): 1 via ORAL
  Filled 2022-02-19 (×2): qty 1

## 2022-02-19 MED ORDER — ONDANSETRON HCL 4 MG/2ML IJ SOLN: 4.0000 mg | Freq: Four times a day (QID) | INTRAMUSCULAR | Status: DC | PRN

## 2022-02-19 MED ORDER — DIBUCAINE (PERIANAL) 1 % EX OINT: 1.0000 | TOPICAL_OINTMENT | CUTANEOUS | Status: DC | PRN

## 2022-02-19 MED ORDER — SIMETHICONE 80 MG PO CHEW: 80.0000 mg | CHEWABLE_TABLET | ORAL | Status: DC | PRN

## 2022-02-19 MED ORDER — OXYCODONE-ACETAMINOPHEN 5-325 MG PO TABS: 1.0000 | ORAL_TABLET | ORAL | Status: DC | PRN

## 2022-02-19 MED ORDER — MEDROXYPROGESTERONE ACETATE 150 MG/ML IM SUSP: 150.0000 mg | INTRAMUSCULAR | Status: DC | PRN

## 2022-02-19 MED ORDER — FENTANYL CITRATE (PF) 100 MCG/2ML IJ SOLN: 100.0000 ug | INTRAMUSCULAR | Status: DC | PRN

## 2022-02-19 MED ORDER — METHYLERGONOVINE MALEATE 0.2 MG PO TABS: 0.2000 mg | ORAL_TABLET | ORAL | Status: DC | PRN

## 2022-02-19 MED ORDER — ONDANSETRON HCL 4 MG PO TABS: 4.0000 mg | ORAL_TABLET | ORAL | Status: DC | PRN

## 2022-02-19 MED ORDER — COCONUT OIL OIL: 1.0000 | TOPICAL_OIL | Status: DC | PRN

## 2022-02-19 MED ORDER — TETANUS-DIPHTH-ACELL PERTUSSIS 5-2.5-18.5 LF-MCG/0.5 IM SUSY: 0.5000 mL | PREFILLED_SYRINGE | Freq: Once | INTRAMUSCULAR | Status: DC

## 2022-02-19 MED ORDER — DIPHENHYDRAMINE HCL 25 MG PO CAPS: 25.0000 mg | ORAL_CAPSULE | Freq: Four times a day (QID) | ORAL | Status: DC | PRN

## 2022-02-19 MED ORDER — FENTANYL-BUPIVACAINE-NACL 0.5-0.125-0.9 MG/250ML-% EP SOLN
12.0000 mL/h | EPIDURAL | Status: DC | PRN
  Administered 2022-02-19: 12 mL/h via EPIDURAL
  Filled 2022-02-19: qty 250

## 2022-02-19 MED ORDER — SENNOSIDES-DOCUSATE SODIUM 8.6-50 MG PO TABS
2.0000 | ORAL_TABLET | ORAL | Status: DC
  Administered 2022-02-19 – 2022-02-20 (×2): 2 via ORAL
  Filled 2022-02-19 (×3): qty 2

## 2022-02-19 MED ORDER — LIDOCAINE HCL (PF) 1 % IJ SOLN: 30.0000 mL | INTRAMUSCULAR | Status: DC | PRN

## 2022-02-19 MED ORDER — DIPHENHYDRAMINE HCL 50 MG/ML IJ SOLN: 12.5000 mg | INTRAMUSCULAR | Status: DC | PRN

## 2022-02-19 MED ORDER — MEASLES, MUMPS & RUBELLA VAC IJ SOLR: 0.5000 mL | Freq: Once | INTRAMUSCULAR | Status: DC

## 2022-02-19 MED ORDER — OXYTOCIN-SODIUM CHLORIDE 30-0.9 UT/500ML-% IV SOLN
2.5000 [IU]/h | INTRAVENOUS | Status: DC
  Administered 2022-02-19: 2.5 [IU]/h via INTRAVENOUS
  Filled 2022-02-19: qty 500

## 2022-02-19 MED ORDER — OXYTOCIN BOLUS FROM INFUSION
333.0000 mL | Freq: Once | INTRAVENOUS | Status: AC
  Administered 2022-02-19: 333 mL via INTRAVENOUS

## 2022-02-19 NOTE — MAU Note (Signed)
CBG 118 

## 2022-02-19 NOTE — Anesthesia Preprocedure Evaluation (Signed)
Anesthesia Evaluation  Patient identified by MRN, date of birth, ID band Patient awake    Reviewed: Allergy & Precautions, NPO status   Airway Mallampati: III  TM Distance: >3 FB Neck ROM: Full    Dental   Pulmonary former smoker   Pulmonary exam normal breath sounds clear to auscultation       Cardiovascular negative cardio ROS  Rhythm:Regular Rate:Normal     Neuro/Psych negative neurological ROS     GI/Hepatic Neg liver ROS,GERD  ,,  Endo/Other  diabetes, Gestational, Oral Hypoglycemic Agents, Insulin Dependent    Renal/GU negative Renal ROS     Musculoskeletal   Abdominal   Peds  Hematology negative hematology ROS (+)   Anesthesia Other Findings   Reproductive/Obstetrics (+) Pregnancy                             Anesthesia Physical Anesthesia Plan  ASA: 2  Anesthesia Plan: Epidural   Post-op Pain Management:    Induction:   PONV Risk Score and Plan:   Airway Management Planned:   Additional Equipment:   Intra-op Plan:   Post-operative Plan:   Informed Consent: I have reviewed the patients History and Physical, chart, labs and discussed the procedure including the risks, benefits and alternatives for the proposed anesthesia with the patient or authorized representative who has indicated his/her understanding and acceptance.       Plan Discussed with: Anesthesiologist  Anesthesia Plan Comments: (I have discussed risks of neuraxial anesthesia including but not limited to infection, bleeding, nerve injury, back pain, headache, seizures, and failure of block. Patient denies bleeding disorders and is not currently anticoagulated. Labs have been reviewed. Risks and benefits discussed. All patient's questions answered.  )       Anesthesia Quick Evaluation

## 2022-02-19 NOTE — Lactation Note (Signed)
This note was copied from a baby's chart. Lactation Consultation Note  Patient Name: Julie Ellison Date: 02/19/2022 Reason for consult: Initial assessment;Early term 37-38.6wks Age:26 hours   P2: Early term infant at 37+2 weeks Feeding preference: Pump and bottle feed expressed milk Mother was GDM; First blood sugar was 66 mg/dl  Verified mother's feeding plan to pump and bottle feed her expressed milk.  "Julie Ellison" has already formula fed twice since birth with amounts of 15 mls and 30 mls.    Initiated the electric pump for mother.  Pump parts, assembly and cleaning reviewed.  #21 flanges are the appropriate size at this time.  Taught mother how to assess for correct flange size.  After 15 minutes of pumping mother was able to express a few drops which she finger fed to "Julie Ellison."  After pumping baby became very fussy and mother decided that she wanted to try latching.  Assisted to latch in the cross cradle hold easily and observed baby feeding for 6 minutes before leaving the room.  Suggested that if mother decides to continue latching to be sure to latch prior to pumping.    Mother does not have an electric pump for home use.  Stork pump form scanned and followed up with a phone call.  Mother chose the Spectra pump.  Will alert mother if her insurance company does not qualify.  Mother appreciative.  Support person and one other visitor present.  RN updated.   Maternal Data Has patient been taught Hand Expression?: Yes Does the patient have breastfeeding experience prior to this delivery?: No (Mother pumped and bottle fed for 8 months with her first child)  Feeding Mother's Current Feeding Choice: Breast Milk and Formula Nipple Type: Slow - flow  LATCH Score                    Lactation Tools Discussed/Used Tools: Pump;Flanges Flange Size: 21 Breast pump type: Double-Electric Breast Pump;Manual Pump Education: Setup, frequency, and cleaning;Milk  Storage Reason for Pumping: Breast stimulation for supplemention; mother desires to pump and bottle feed Pumping frequency: Every three hours Pumped volume:  (Few drops)  Interventions Interventions: Education;LC Services brochure;DEBP  Discharge Pump: Stork Pump [place] sent for [place] [employer] pump)  Consult Status Consult Status: Follow-up Date: 02/20/22 Follow-up type: In-patient    Jensine Luz R Raizy Auzenne 02/19/2022, 1:43 PM

## 2022-02-19 NOTE — MAU Provider Note (Signed)

## 2022-02-19 NOTE — Progress Notes (Signed)
Pt to BS via w/c to 215

## 2022-02-19 NOTE — Anesthesia Procedure Notes (Signed)
Epidural Patient location during procedure: OB Start time: 02/19/2022 4:55 AM End time: 02/19/2022 5:02 AM  Staffing Anesthesiologist: Nilda Simmer, MD Performed: anesthesiologist   Preanesthetic Checklist Completed: patient identified, IV checked, site marked, risks and benefits discussed, surgical consent, monitors and equipment checked, pre-op evaluation and timeout performed  Epidural Patient position: sitting Prep: DuraPrep and site prepped and draped Patient monitoring: continuous pulse ox and blood pressure Approach: midline Location: L3-L4 Injection technique: LOR saline  Needle:  Needle type: Tuohy  Needle gauge: 17 G Needle length: 9 cm and 9 Needle insertion depth: 6 cm Catheter type: closed end flexible Catheter size: 19 Gauge Catheter at skin depth: 10 cm Test dose: negative  Assessment Events: blood not aspirated, no cerebrospinal fluid, injection not painful, no injection resistance, no paresthesia and negative IV test  Additional Notes The patient has requested an epidural for labor pain management. Risks and benefits including, but not limited to, infection, bleeding, local anesthetic toxicity, headache, hypotension, back pain, block failure, etc. were discussed with the patient. The patient expressed understanding and consented to the procedure. I confirmed that the patient has no bleeding disorders and is not taking blood thinners. I confirmed the patient's last platelet count with the nurse. A time-out was performed immediately prior to the procedure. Please see nursing documentation for vital signs. Sterile technique was used throughout the whole procedure. Once LOR achieved, the epidural catheter threaded easily without resistance. Aspiration of the catheter was negative for blood and CSF. The epidural was dosed slowly and an infusion was started.  1 attempt(s)Reason for block:procedure for pain

## 2022-02-19 NOTE — Discharge Summary (Signed)
Postpartum Discharge Summary    Patient Name: Julie Ellison DOB: [birth year] MRN: [id number]  Date of admission: 02/19/2022 Delivery date:02/19/2022  Delivering provider: Christin Fudge  Date of discharge: 02/20/2022  Admitting diagnosis: Uterine contractions during pregnancy [O47.9] Intrauterine pregnancy: [redacted]w[redacted]d     Secondary diagnosis:  Principal Problem:   Uterine contractions during pregnancy Active Problems:   Vaginal delivery  Additional problems: A2DM    Discharge diagnosis: Term Pregnancy Delivered and GDM A2                                              Post partum procedures: n/a Augmentation: AROM Complications: None  Hospital course: Onset of Labor With Vaginal Delivery      27 y.o. yo G3P1011 at [redacted]w[redacted]d was admitted in Active Labor on 02/19/2022. Labor course was complicated by  A4ZY  Membrane Rupture Time/Date: 6:38 AM ,02/19/2022   Delivery Method:Vaginal, Spontaneous  Episiotomy: None  Lacerations:  None  Patient had a postpartum course complicated by n/a.  She is ambulating, tolerating a regular diet, passing flatus, and urinating well. Patient is discharged home in stable condition on 02/20/22.  Newborn Data: Birth date:02/19/2022  Birth time:7:49 AM  Gender:Female  Living status:Living  Apgars:9 ,9  Weight:2890 g   Magnesium Sulfate received: No BMZ received: No Rhophylac:N/A MMR:N/A T-DaP:Given prenatally Flu: No Transfusion:No  Physical exam  Vitals:   02/19/22 1419 02/19/22 1803 02/19/22 2226 02/20/22 0510  BP: 119/68 115/68 118/70 111/69  Pulse: 82 77 77 80  Resp: 16 16 18 18   Temp: 98.5 F (36.9 C) 98.6 F (37 C) 98.1 F (36.7 C) 98.5 F (36.9 C)  TempSrc: Oral Oral Oral Oral  SpO2: 99% 98% 99% 99%  Weight:      Height:       General: alert, cooperative, and no distress Lochia: appropriate Uterine Fundus: firm Incision: N/A DVT Evaluation: No evidence of DVT seen on physical exam. Labs: Lab Results   Component Value Date   WBC 9.9 02/19/2022   HGB 13.3 02/19/2022   HCT 39.7 02/19/2022   MCV 84.6 02/19/2022   PLT 244 02/19/2022      Latest Ref Rng & Units 07/27/2021   12:57 PM  CMP  Glucose 70 - 99 mg/dL 98   BUN 6 - 20 mg/dL 6   Creatinine 0.44 - 1.00 mg/dL 0.52   Sodium 135 - 145 mmol/L 136   Potassium 3.5 - 5.1 mmol/L 3.4   Chloride 98 - 111 mmol/L 104   CO2 22 - 32 mmol/L 22   Calcium 8.9 - 10.3 mg/dL 8.6   Total Protein 6.5 - 8.1 g/dL 7.0   Total Bilirubin 0.3 - 1.2 mg/dL 0.4   Alkaline Phos 38 - 126 U/L 61   AST 15 - 41 U/L 14   ALT 0 - 44 U/L 17    Edinburgh Score:    02/20/2022    5:10 AM  Edinburgh Postnatal Depression Scale Screening Tool  I have been able to laugh and see the funny side of things. 0  I have looked forward with enjoyment to things. 0  I have blamed myself unnecessarily when things went wrong. 0  I have been anxious or worried for no good reason. 0  I have felt scared or panicky for no good reason. 0  Things have been getting on top of  me. 0  I have been so unhappy that I have had difficulty sleeping. 0  I have felt sad or miserable. 0  I have been so unhappy that I have been crying. 0  The thought of harming myself has occurred to me. 0  Edinburgh Postnatal Depression Scale Total 0     After visit meds:  Allergies as of 02/20/2022   No Known Allergies      Medication List     STOP taking these medications    Accu-Chek Guide test strip Generic drug: glucose blood   Accu-Chek Softclix Lancets lancets   insulin NPH Human 100 UNIT/ML injection Commonly known as: NOVOLIN N   Insulin Syringe-Needle U-100 31G X 5/16" 0.3 ML Misc Commonly known as: RELION INSULIN SYR 0.3ML/31G   metFORMIN 500 MG tablet Commonly known as: GLUCOPHAGE       TAKE these medications    aspirin EC 81 MG tablet Take 1 tablet (81 mg total) by mouth daily. Take after 12 weeks for prevention of preeclampssia later in pregnancy   ibuprofen 600 MG  tablet Commonly known as: ADVIL Take 1 tablet (600 mg total) by mouth every 6 (six) hours.   PRENATAL VITAMIN PO Take 1 tablet by mouth daily.         Discharge home in stable condition Infant Feeding: Bottle and Breast Infant Disposition:home with mother Discharge instruction: per After Visit Summary and Postpartum booklet. Activity: Advance as tolerated. Pelvic rest for 6 weeks.  Diet: routine diet Future Appointments: Future Appointments  Date Time Provider [place]  02/25/2022  8:15 AM Helaine Chess [facility]  02/25/2022 11:15 AM [facility]   Follow up Visit:  [facility] Follow up.   Specialty: Obstetrics and Gynecology Why: In 4 weeks for a postpartum appt Contact information: [place] [phone]                 Please schedule this patient for a In person postpartum visit in 4 weeks with the following provider: Any provider. Additional Postpartum F/U:2 hour GTT  Low risk pregnancy complicated by: GDM Delivery mode:  Vaginal, Spontaneous  Anticipated Birth Control:  IUD   02/20/2022 Wende Mott, CNM

## 2022-02-19 NOTE — Progress Notes (Signed)
Lynnda Child CNM called to notifie of pt's admission and status. CNM unavailable due to code hemorrhage on MB5. Carmelia Roller CNM in MAU aware and will place admit orders.

## 2022-02-19 NOTE — Anesthesia Postprocedure Evaluation (Signed)
Anesthesia Post Note  Patient: Julie Ellison  Procedure(s) Performed: AN AD [place]     Patient location during evaluation: Mother Baby Anesthesia Type: Epidural Level of consciousness: awake Pain management: satisfactory to patient Vital Signs Assessment: post-procedure vital signs reviewed and stable Respiratory status: spontaneous breathing Cardiovascular status: stable Anesthetic complications: no  No notable events documented.  Last Vitals:  Vitals:   02/19/22 0934 02/19/22 1034  BP: 117/69 108/77  Pulse: 81 87  Resp: 16 16  Temp: 36.9 C 36.7 C  SpO2: 100% 100%    Last Pain:  Vitals:   02/19/22 1231  TempSrc:   PainSc: 0-No pain   Pain Goal: Patients Stated Pain Goal: 0 (02/19/22 0315)                 Casimer Lanius

## 2022-02-19 NOTE — MAU Note (Signed)
.  Julie Ellison is a 27 y.o. at [redacted]w[redacted]d here in MAU reporting ctxs since 2200 that have gotten closer and stronger. Denies LOF or VB. Reports good FM. 3cm last sve  Onset of complaint: 2200 Pain score: 7 Vitals:   02/19/22 0312 02/19/22 0315  BP:  123/86  Pulse: 90   Resp: 18   Temp: 97.8 F (36.6 C)   SpO2: 100%      FHT:152 Lab orders placed from triage:  mau labor eval

## 2022-02-19 NOTE — Progress Notes (Signed)
Patient Vitals for the past 4 hrs:  BP Temp Pulse Resp SpO2 Height Weight  02/19/22 0600 112/66 -- 89 -- -- -- --  02/19/22 0535 116/67 -- 94 -- -- -- --  02/19/22 0530 115/62 -- 85 -- -- -- --  02/19/22 0525 111/72 -- (!) 105 -- -- -- --  02/19/22 0520 116/73 -- (!) 102 -- -- -- --  02/19/22 0515 123/80 -- 83 -- 99 % -- --  02/19/22 0510 (!) 114/94 -- 95 -- 99 % -- --  02/19/22 0506 122/76 -- 86 -- 100 % -- --  02/19/22 0502 (!) 143/90 -- 96 -- 100 % -- --  02/19/22 0420 130/86 -- 85 -- -- -- --  02/19/22 0315 123/86 -- -- -- -- -- --  02/19/22 0312 -- 97.8 F (36.6 C) 90 18 100 % 5\' 3"  (1.6 m) 83.5 kg   Comfortable w/epidural.  FHR Cat 1.  BS 122. Ctx q 3-5  minutes.  Cx 8/100/0 station.  AROM w/clear fluid.  Expectant mgt.

## 2022-02-19 NOTE — H&P (Signed)
Julie Ellison is a 27 y.o. female G2P1011 with IUP at [redacted]w[redacted]d by [place] presenting for labor.  Started contracting around 2200. She reports positive fetal movement. She denies leakage of fluid or vaginal bleeding.  Prenatal History/Complications: PNC at [facility] Pregnancy complications:  - Past Medical History: Past Medical History:  Diagnosis Date   Diabetes mellitus without complication ([place])    Gestational diabetes    History of ectopic pregnancy 11/04/2021   Managed surgically, R salpingectomy   History of gestational diabetes in prior pregnancy, currently pregnant 11/03/2021   History of pre-eclampsia in prior pregnancy, currently pregnant 09/24/2019   [x]  ASA [ ] Baseline P:C ratio    Latest Ref Rng & Units 07/27/2021   12:57 PM Glucose 70 - 99 mg/dL 98  BUN 6 - 20 mg/dL 6  Creatinine 0.44 - 1.00 mg/dL 0.52  Sodium 135 - 145 mmol/L 136  Potassium 3.5 - 5.1 mmol/L 3.4  Chloride 98 - 111 mmol/L 104  CO2 22 - 32 mmol/L 22  Calcium 8.9 - 10.3 mg/dL 8.6  Total Protein 6.5 - 8.1 g/dL 7.0  Total Bilirubin 0.3 - 1.2 mg/dL 0.4  Alkaline Phos 38 - 126 U/L    Hx of gonorrhea 2017   Hypertension     Past Surgical History: Past Surgical History:  Procedure Laterality Date   DIAGNOSTIC LAPAROSCOPY WITH REMOVAL OF ECTOPIC PREGNANCY N/A 11/03/2017   Procedure: DIAGNOSTIC LAPAROSCOPY, Right Salpingectomy with removal ECTOPIC PREGNANCY;  Surgeon: Lavonia Drafts, MD;  Location: [facility];  Service: Gynecology;  Laterality: N/A;    Obstetrical History: OB History     Gravida  3   Para  1   Term  1   Preterm      AB  1   Living  1      SAB      IAB      Ectopic  1   Multiple      Live Births  1            Social History: Social History   Socioeconomic History   Marital status: Married    Spouse name: Taurino   Number of children: Not on file   Years of education: Not on file   Highest education level: High school graduate  Occupational History   Not on  file  Tobacco Use   Smoking status: Former   Smokeless tobacco: Never  Scientific laboratory technician Use: Never used  Substance and Sexual Activity   Alcohol use: Not Currently   Drug use: Never   Sexual activity: Yes    Birth control/protection: None  Other Topics Concern   Not on file  Social History Narrative   Not on file   Social Determinants of Health   Financial Resource Strain: Not on file  Food Insecurity: Not on file  Transportation Needs: Not on file  Physical Activity: Not on file  Stress: Not on file  Social Connections: Not on file    Family History: Family History  Problem Relation Age of Onset   Hypertension Maternal Grandfather    Asthma Neg Hx    Diabetes Neg Hx    Heart disease Neg Hx    Stroke Neg Hx     Allergies: No Known Allergies  Medications Prior to Admission  Medication Sig Dispense Refill Last Dose   insulin NPH Human (NOVOLIN N) 100 UNIT/ML injection Inject 0.07 mLs (7 Units total) into the skin 2 (two) times daily before a meal. 10 mL  0 02/18/2022 at 1930   metFORMIN (GLUCOPHAGE) 500 MG tablet Take 2 tablets (1,000 mg total) by mouth daily after supper. 60 tablet 2 02/18/2022   Prenatal Vit-Fe Fumarate-FA (PRENATAL VITAMIN PO) Take 1 tablet by mouth daily.   02/18/2022   Accu-Chek Softclix Lancets lancets Use 4 times daily as instructed 100 each 12    aspirin EC 81 MG tablet Take 1 tablet (81 mg total) by mouth daily. Take after 12 weeks for prevention of preeclampssia later in pregnancy (Patient not taking: Reported on 02/03/2022) 300 tablet 2    Blood Glucose Monitoring Suppl (ACCU-CHEK GUIDE) w/Device KIT Use 4 times daily as instructed 1 kit 0    glucose blood (ACCU-CHEK GUIDE) test strip Use 4 times daily as instructed 100 each 12    Insulin Syringe-Needle U-100 (RELION INSULIN SYR 0.3ML/31G) 31G X 5/16" 0.3 ML MISC 1 each by Does not apply route 2 (two) times daily. After breakfast and dinner 100 each 3     Review of Systems   Constitutional:  Negative for fever and chills Eyes: Negative for visual disturbances Respiratory: Negative for shortness of breath, dyspnea Cardiovascular: Negative for chest pain or palpitations  Gastrointestinal: Negative for vomiting, diarrhea and constipation.  POSITIVE for abdominal pain (contractions) Genitourinary: Negative for dysuria and urgency Musculoskeletal: Negative for back pain, joint pain, myalgias  Neurological: Negative for dizziness and headaches  Blood pressure 130/86, pulse 85, temperature 97.8 F (36.6 C), resp. rate 18, height 5\' 3"  (1.6 m), weight 83.5 kg, last menstrual period 05/24/2021, SpO2 100 %, unknown if currently breastfeeding. General appearance: alert, cooperative, and no distress Lungs: normal respiratory effort Heart: regular rate and rhythm Abdomen: soft, non-tender; bowel sounds normal Extremities: Homans sign is negative, no sign of DVT DTR's 2+ Presentation: cephalic Fetal monitoring  Baseline: 140 bpm, Variability: Good {> 6 bpm), Accelerations: Reactive, and Decelerations: Absent Uterine activity  2-3 minutes Dilation: 4.5 Effacement (%): 90 Station: -1 Exam by:: Blima Singer rNC   Prenatal labs: ABO, Rh: --/--/O POS (01/19 0350) Antibody: NEG (01/19 0350) Rubella: 1.96 (10/17 0954) RPR: Non Reactive (11/01 0828)  HBsAg: Negative (10/17 0954)  HIV: Non Reactive (11/01 4008)  GBS: Negative/-- (01/11 1633)    Nursing Staff Provider  Office Location [facility] Dating  9 wk [place] (no records) and 24 week [place]. See MFM note.  Nichols Model Valu.Nieves ] Traditional [ ]  Centering [ ]  Mom-Baby Dyad Anatomy US  normal  Language  English     Flu Vaccine   Declined 12/02/2021 Genetic/Carrier Screen  NIPS:  Low risk female  AFP:   Too late Horizon: Nml  TDaP Vaccine   12/02/21 Hgb A1C or  GTT Early 4.5 Third trimester 106/167/104  COVID Vaccine  Vax x2    LAB RESULTS   Rhogam  O/Positive/-- (10/17 6761)  NA Blood Type O/Positive/-- (10/17 0954) O pos  Baby Feeding Plan Breast ?  Antibody Negative (10/17 0954)  Contraception Yes, IUD Rubella 1.96 (10/17 9509)  Circumcision If boy, No RPR Non Reactive (10/17 0954)   Pediatrician  Will give List  HBsAg Negative (10/17 0954)   Support Person FOB HCVAb Non Reactive (10/17 0954)   Prenatal Classes  HIV Non Reactive (10/17 0954)     BTL Consent  GBS  negative  VBAC Consent NA Pap Nml 2023 Absent transformation zone       DME Rx [ ]  BP cuff [ ]  Weight Scale Waterbirth  [ ]  Class [ ]  Consent [ ]  CNM visit  PHQ9 & GAD7 Arly.Keller  ] new OB [  ] 48 weeks  [  ] 36 weeks Induction  [ ]  Orders Entered [ ] Foley Y/N    Prenatal Transfer Tool  Maternal Diabetes: Yes:  Diabetes Type:  Insulin/Medication controlled Genetic Screening: Normal Maternal Ultrasounds/Referrals: Normal Fetal Ultrasounds or other Referrals:  None Maternal Substance Abuse:  No Significant Maternal Medications:  None Significant Maternal Lab Results: Group B Strep negative  Results for orders placed or performed during the hospital encounter of 02/19/22 (from the past 24 hour(s))  Type and screen [facility]   Collection Time: 02/19/22  3:50 AM  Result Value Ref Range   ABO/RH(D) O POS    Antibody Screen NEG    Sample Expiration      02/22/2022,2359 Performed at [facility], [address], [place], [address] [place]   Glucose, capillary   Collection Time: 02/19/22  4:10 AM  Result Value Ref Range   Glucose-Capillary 108 (H) 70 - 99 mg/dL    Assessment: Julie Ellison is a 27 y.o. [phone] with an IUP at [redacted]w[redacted]d presenting for labor  Plan: #Labor: expectant management #Pain:  Per request #FWB Cat 1   [id number] 02/19/2022, 4:42 AM

## 2022-02-20 MED ORDER — IBUPROFEN 600 MG PO TABS: 600.0000 mg | ORAL_TABLET | Freq: Four times a day (QID) | ORAL | 0 refills | Status: DC

## 2022-02-20 NOTE — Lactation Note (Signed)
This note was copied from a baby's chart. Lactation Consultation Note  Patient Name: Julie Ellison DUKGU'[id number] Date: 02/20/2022 Reason for consult: Follow-up assessment Age:26 hours   P2: Early term infant at 37+2 weeks Feeding preference: Pump and bottle feed expressed milk                                  Formula until milk comes to volume Weight loss: 6%  Family has been discharged.  Mother has been breast feeding some but primarily formula feeding at this time.  Baby is taking 30+ mls now/feeding.  Encouraged to continue latching prior to giving formula supplementation.  Family has our op phone number for any questions/concerns after discharge.  Stork pump has been delivered to patient.     Maternal Data    Feeding    LATCH Score                    Lactation Tools Discussed/Used    Interventions    Discharge Discharge Education: Engorgement and breast care Pump: Stork Pump  Consult Status Consult Status: Complete Date: 02/20/22 Follow-up type: Call as needed    Julie Ellison 02/20/2022, 11:57 AM

## 2022-02-25 ENCOUNTER — Other Ambulatory Visit: Payer: Self-pay

## 2022-02-25 ENCOUNTER — Encounter: Payer: Self-pay | Admitting: Certified Nurse Midwife

## 2022-02-27 ENCOUNTER — Telehealth (HOSPITAL_COMMUNITY): Payer: Self-pay

## 2022-02-27 NOTE — Telephone Encounter (Signed)
Patient reports feeling great. Patient declines questions/concerns about her health and healing.  Patient reports that baby is doing well. Patient reports that she was concerned about baby's umbilical cord stump but that she was able to get an appointment and see the pediatrician today. Baby sleeps in a bassinet. RN reviewed ABC's of safe sleep with patient. Patient declines any questions or concerns about baby.  EPDS score is 0.  [name] [facility] 02/27/22,1517

## 2022-03-15 DIAGNOSIS — R102 Pelvic and perineal pain: Secondary | ICD-10-CM | POA: Diagnosis not present

## 2022-04-01 ENCOUNTER — Other Ambulatory Visit: Payer: Self-pay | Admitting: General Practice

## 2022-04-01 DIAGNOSIS — Z8632 Personal history of gestational diabetes: Secondary | ICD-10-CM

## 2022-04-02 ENCOUNTER — Other Ambulatory Visit: Payer: 59

## 2022-04-02 ENCOUNTER — Encounter: Payer: Self-pay | Admitting: Certified Nurse Midwife

## 2022-04-02 ENCOUNTER — Other Ambulatory Visit: Payer: Self-pay

## 2022-04-02 ENCOUNTER — Ambulatory Visit (INDEPENDENT_AMBULATORY_CARE_PROVIDER_SITE_OTHER): Payer: 59 | Admitting: Certified Nurse Midwife

## 2022-04-02 DIAGNOSIS — Z8632 Personal history of gestational diabetes: Secondary | ICD-10-CM

## 2022-04-02 NOTE — Progress Notes (Signed)
Postpartum Visit Note  Julie Ellison is a 27 y.o. [phone] female who presents for a postpartum visit. She is 6 weeks postpartum following a normal spontaneous vaginal delivery.  I have fully reviewed the prenatal and intrapartum course. The delivery was at 8w2dgestational weeks.  Anesthesia: none. Postpartum course has been uncomplicated. Baby is doing well. Baby is feeding by both breast and bottle - Similac . Bleeding no bleeding. Bowel function is normal. Bladder function is normal. Patient is sexually active. Contraception method is none. Postpartum depression screening: negative.   Upstream - 04/02/22 0954       Pregnancy Intention Screening   Does the patient want to become pregnant in the next year? No    Does the patient's partner want to become pregnant in the next year? No    Would the patient like to discuss contraceptive options today? Yes            The pregnancy intention screening data noted above was reviewed. Potential methods of contraception were discussed. The patient elected to proceed with No data recorded.   Edinburgh Postnatal Depression Scale - 04/02/22 0953       Edinburgh Postnatal Depression Scale:  In the Past 7 Days   I have been able to laugh and see the funny side of things. 0    I have looked forward with enjoyment to things. 0    I have blamed myself unnecessarily when things went wrong. 0    I have been anxious or worried for no good reason. 0    I have felt scared or panicky for no good reason. 0    Things have been getting on top of me. 0    I have been so unhappy that I have had difficulty sleeping. 0    I have felt sad or miserable. 0    I have been so unhappy that I have been crying. 0    The thought of harming myself has occurred to me. 0    Edinburgh Postnatal Depression Scale Total 0             Health Maintenance Due  Topic Date Due   COVID-19 Vaccine (1) Never done   FOOT EXAM  Never done   OPHTHALMOLOGY  EXAM  Never done   HPV VACCINES (1 - 2-dose series) Never done    The following portions of the patient's history were reviewed and updated as appropriate: allergies, current medications, past family history, past medical history, past social history, past surgical history, and problem list.  Review of Systems Pertinent items noted in HPI and remainder of comprehensive ROS otherwise negative.  Objective:  BP 125/66   Pulse 63   Wt 173 lb (78.5 kg)   LMP 05/24/2021 (Approximate)   Breastfeeding Yes   BMI 30.65 kg/m    Constitutional: Alert, oriented female in no physical distress.  HEENT: PERRLA Skin: normal color and turgor, no rash Cardiovascular: normal rate & rhythm, warm and well perfused Respiratory: normal effort, no problems with respiration noted GI: Abd soft, non-tender MS: Extremities nontender, no edema, normal ROM Neurologic: Alert and oriented x 4.  GU: no CVA tenderness Pelvic: not indicated  Assessment:  Postpartum care and examination  History of gestational diabetes - GTT scheduled for next week  Plan:   Essential components of care per ACOG recommendations:  1.  Mood and well being: Patient with negative depression screening today. Reviewed local resources for support.  - Patient tobacco  use? No.   - hx of drug use? No.    2. Infant care and feeding:  -Patient currently breastmilk feeding? Yes. Reviewed importance of draining breast regularly to support lactation.  -Social determinants of health (SDOH) reviewed in EPIC. No concerns  3. Sexuality, contraception and birth spacing - Patient does not want a pregnancy in the next year.  Desired family size is 2 children.  - Reviewed reproductive life planning. Reviewed contraceptive methods based on pt preferences and effectiveness.  Patient desired IUD or IUS today, but had sex two days ago so will come back for placement in two weeks. Advised to use condoms until IUD appointment. - Discussed birth  spacing of 18 months  4. Sleep and fatigue -Encouraged family/partner/community support of 4 hrs of uninterrupted sleep to help with mood and fatigue  5. Physical Recovery  - Discussed patients delivery and complications. She describes her labor as good. - Patient had a Vaginal, no problems at delivery. Patient had  no  laceration. Perineal healing reviewed. Patient expressed understanding - Patient has urinary incontinence? No. - Patient is safe to resume physical and sexual activity  6.  Health Maintenance - HM due items addressed Yes - Last pap smear  Diagnosis  Date Value Ref Range Status  11/17/2021   Final   - Negative for intraepithelial lesion or malignancy (NILM)   Pap smear not done at today's visit.  -Breast Cancer screening indicated? No.   7. Chronic Disease/Pregnancy Condition follow up: Gestational Diabetes - PCP follow up as needed  Gabriel Carina, [place] [facility]

## 2022-04-02 NOTE — Patient Instructions (Signed)
Start taking Pump Princess by [employer] - can get at [employer] or on [email or web address]

## 2022-04-14 ENCOUNTER — Other Ambulatory Visit: Payer: Self-pay

## 2022-04-14 ENCOUNTER — Other Ambulatory Visit: Payer: 59

## 2022-04-14 DIAGNOSIS — Z8632 Personal history of gestational diabetes: Secondary | ICD-10-CM

## 2022-04-15 LAB — GLUCOSE TOLERANCE, 2 HOURS
Glucose, 2 hour: 92 mg/dL (ref 70–139)
Glucose, GTT - Fasting: 92 mg/dL (ref 70–99)

## 2022-04-22 ENCOUNTER — Ambulatory Visit (INDEPENDENT_AMBULATORY_CARE_PROVIDER_SITE_OTHER): Payer: 59 | Admitting: Family Medicine

## 2022-04-22 ENCOUNTER — Encounter: Payer: Self-pay | Admitting: Family Medicine

## 2022-04-22 ENCOUNTER — Other Ambulatory Visit: Payer: Self-pay

## 2022-04-22 VITALS — BP 121/77 | HR 88 | Ht 63.0 in | Wt 173.8 lb

## 2022-04-22 DIAGNOSIS — Z30430 Encounter for insertion of intrauterine contraceptive device: Secondary | ICD-10-CM

## 2022-04-22 DIAGNOSIS — Z3040 Encounter for surveillance of contraceptives, unspecified: Secondary | ICD-10-CM | POA: Diagnosis not present

## 2022-04-22 DIAGNOSIS — Z975 Presence of (intrauterine) contraceptive device: Secondary | ICD-10-CM | POA: Insufficient documentation

## 2022-04-22 LAB — POCT PREGNANCY, URINE: Preg Test, Ur: NEGATIVE

## 2022-04-22 MED ORDER — LEVONORGESTREL 20.1 MCG/DAY IU IUD
1.0000 | INTRAUTERINE_SYSTEM | Freq: Once | INTRAUTERINE | Status: AC
  Administered 2022-04-22: 1 via INTRAUTERINE

## 2022-04-22 NOTE — Patient Instructions (Addendum)
Follow up in 2 weeks for a string check.  OK if you experience increased vaginal bleeding or the coffee grounds discharge from the solution I applied to stop the bleeding  Your IUD is approved for up to 8 years for contraception.

## 2022-04-22 NOTE — Progress Notes (Signed)
IUD Insertion Procedure Note  Pre-operative Diagnosis: Desire for contraception  Post-operative Diagnosis: same, IUD in place  Indications: contraception  Procedure Details  Urine pregnancy test was done and result was negative.  The risks (including infection, bleeding, pain, and uterine perforation) and benefits of the procedure were explained to the patient and Written informed consent was obtained.    Cervix cleansed with Betadine. Uterus sounded to 10 cm. IUD inserted without difficulty. String visible and trimmed. Noted to have a sudden gush of blood, about 50cc after tenaculum was removed. Pressure applied, using a sponge stick and some monsel solution was applied to the cervix. Patient tolerated procedure well.  IUD Information: Lot # [id number], Expiration date 10/2024, [place] IUD.  Condition: Stable  Complications: None  Plan:  The patient was advised to call for any fever or for prolonged or severe pain or bleeding. She was advised to use NSAID as needed for mild to moderate pain.   She will follow up in 2 weeks for a string check.  Liliane Channel MD MPH OB Fellow, [place] [facility] [place] 04/22/2022

## 2023-02-05 ENCOUNTER — Emergency Department (HOSPITAL_COMMUNITY): Payer: 59

## 2023-02-05 ENCOUNTER — Emergency Department (HOSPITAL_COMMUNITY)
Admission: EM | Admit: 2023-02-05 | Discharge: 2023-02-05 | Disposition: A | Discharge status: Home / Self Care | Payer: 59 | Attending: Emergency Medicine | Admitting: Emergency Medicine

## 2023-02-05 ENCOUNTER — Encounter (HOSPITAL_COMMUNITY): Payer: Self-pay

## 2023-02-05 ENCOUNTER — Other Ambulatory Visit: Payer: Self-pay

## 2023-02-05 DIAGNOSIS — R0602 Shortness of breath: Secondary | ICD-10-CM | POA: Diagnosis not present

## 2023-02-05 DIAGNOSIS — R079 Chest pain, unspecified: Secondary | ICD-10-CM | POA: Diagnosis not present

## 2023-02-05 DIAGNOSIS — R0789 Other chest pain: Secondary | ICD-10-CM | POA: Diagnosis not present

## 2023-02-05 LAB — CBC WITH DIFFERENTIAL/PLATELET
Abs Immature Granulocytes: 0.02 10*3/uL (ref 0.00–0.07)
Basophils Absolute: 0 10*3/uL (ref 0.0–0.1)
Basophils Relative: 1 %
Eosinophils Absolute: 0.1 10*3/uL (ref 0.0–0.5)
Eosinophils Relative: 1 %
HCT: 46.2 % — ABNORMAL HIGH (ref 36.0–46.0)
Hemoglobin: 15.3 g/dL — ABNORMAL HIGH (ref 12.0–15.0)
Immature Granulocytes: 0 %
Lymphocytes Relative: 33 %
Lymphs Abs: 2.6 10*3/uL (ref 0.7–4.0)
MCH: 29.8 pg (ref 26.0–34.0)
MCHC: 33.1 g/dL (ref 30.0–36.0)
MCV: 90.1 fL (ref 80.0–100.0)
Monocytes Absolute: 0.4 10*3/uL (ref 0.1–1.0)
Monocytes Relative: 5 %
Neutro Abs: 4.7 10*3/uL (ref 1.7–7.7)
Neutrophils Relative %: 60 %
Platelets: 288 10*3/uL (ref 150–400)
RBC: 5.13 MIL/uL — ABNORMAL HIGH (ref 3.87–5.11)
RDW: 11.7 % (ref 11.5–15.5)
WBC: 7.9 10*3/uL (ref 4.0–10.5)
nRBC: 0 % (ref 0.0–0.2)

## 2023-02-05 LAB — BASIC METABOLIC PANEL
Anion gap: 9 (ref 5–15)
BUN: 8 mg/dL (ref 6–20)
CO2: 24 mmol/L (ref 22–32)
Calcium: 8.8 mg/dL — ABNORMAL LOW (ref 8.9–10.3)
Chloride: 106 mmol/L (ref 98–111)
Creatinine, Ser: 0.53 mg/dL (ref 0.44–1.00)
GFR, Estimated: >60 mL/min (ref >60)
Glucose, Bld: 101 mg/dL — ABNORMAL HIGH (ref 70–99)
Potassium: 3.9 mmol/L (ref 3.5–5.1)
Sodium: 139 mmol/L (ref 135–145)

## 2023-02-05 LAB — TROPONIN I (HIGH SENSITIVITY): Troponin I (High Sensitivity): <2 ng/L (ref <18)

## 2023-02-05 LAB — HCG, SERUM, QUALITATIVE: Preg, Serum: NEGATIVE

## 2023-02-05 NOTE — Discharge Instructions (Addendum)
 Today you were seen for chest pain.  You should follow-up with cardiology for further evaluation and treatment. Thank you for letting us  treat you today. After reviewing your labs and imaging, I feel you are safe to go home. Please follow up with your PCP in the next several days and provide them with your records from this visit. Return to the Emergency Room if pain becomes severe or symptoms worsen.

## 2023-02-05 NOTE — ED Provider Triage Note (Signed)
 Emergency Medicine Provider Triage Evaluation Note  Julie Ellison , a 28 y.o. female  was evaluated in triage.  Pt complains of intermittent chest pain over the past year since having her second child.  Today, she complains of midsternal chest pain that lasted 30 minutes and resolved an hour ago.  She reports that today she was getting agitated by her husband worsening chest pain.  She denies shortness of breath, cough, fevers. Review of Systems  Positive: Chest pain Negative: Breath, cough, fevers  Physical Exam  BP 135/84 (BP Location: Left Arm)   Pulse 65   Temp 98.8 F (37.1 C) (Oral)   Resp 18   Ht 5' 3 (1.6 m)   Wt 81.6 kg   SpO2 99%   BMI 31.89 kg/m  Gen:   Awake, no distress   Resp:  Normal effort  MSK:   Moves extremities without difficulty  Other:    Medical Decision Making  Medically screening exam initiated at 1:59 PM.  Appropriate orders placed.  Naziah Portee was informed that the remainder of the evaluation will be completed by another provider, this initial triage assessment does not replace that evaluation, and the importance of remaining in the ED until their evaluation is complete.  Absent x-ray ordered   Minnie Tinnie BRAVO, PA 02/05/23 1400

## 2023-02-05 NOTE — ED Provider Notes (Signed)
  [place] EMERGENCY DEPARTMENT AT [facility] Provider Note   CSN: [id number] Arrival date & time: 02/05/23  1210     Procedures Ultrasound ED Echo  Date/Time: 02/05/2023 4:08 PM  Performed by: Mannie Fairy DASEN, DO Authorized by: Mannie Fairy DASEN, DO   Procedure details:    Indications: chest pain     Images: archived   Findings:    LV Function: normal (>50% EF)     RV Diameter: normal     IVC: normal   Impression:    Impression: normal            Mannie Fairy T, DO 02/05/23 1609

## 2023-02-05 NOTE — ED Triage Notes (Addendum)
 Pt arrived POV from home c/o intermittent centralized CP that has been ongoing for awhile but started back up today at 10. Pt states the pain radiates down to her stomach. Pt denies any N/V. Pt states currently the pain has resolved.

## 2023-02-05 NOTE — ED Provider Notes (Signed)
 [place] EMERGENCY DEPARTMENT AT  HOSPITAL Provider Note   CSN: [id number] Arrival date & time: 02/05/23  1210     History  Chief Complaint  Patient presents with   Chest Pain    Julie Ellison is a 28 y.o. female no significant past medical history presents today for intermittent chest pain x 1 year.  Patient states that the pain started right after having her daughter.  The pain typically last between 10 and 30 minutes.  The pain is in her central and sometimes radiates down to the stomach.  She does endorse some shortness of breath during episodes.  Episodes typically resolve when she focuses on her breathing and rest.  Patient denies nausea, vomiting, abdominal pain, dizziness, or weakness.  Patient denies pain at this time.   Chest Pain      Home Medications Prior to Admission medications   Not on File      Allergies    Patient has no known allergies.    Review of Systems   Review of Systems  Cardiovascular:  Positive for chest pain.    Physical Exam Updated Vital Signs BP 135/84 (BP Location: Left Arm)   Pulse 65   Temp 98.8 F (37.1 C) (Oral)   Resp 18   Ht 5' 3 (1.6 m)   Wt 81.6 kg   SpO2 99%   BMI 31.89 kg/m  Physical Exam Vitals and nursing note reviewed.  Constitutional:      General: She is not in acute distress.    Appearance: She is well-developed.  HENT:     Head: Normocephalic and atraumatic.  Eyes:     Extraocular Movements: Extraocular movements intact.     Conjunctiva/sclera: Conjunctivae normal.  Cardiovascular:     Rate and Rhythm: Normal rate and regular rhythm.     Heart sounds: Normal heart sounds. No murmur heard. Pulmonary:     Effort: Pulmonary effort is normal. No tachypnea or respiratory distress.     Breath sounds: Normal breath sounds.  Chest:     Chest wall: No mass.  Abdominal:     Palpations: Abdomen is soft.     Tenderness: There is no abdominal tenderness.  Musculoskeletal:         General: No swelling.     Cervical back: Normal range of motion and neck supple.  Skin:    General: Skin is warm and dry.     Capillary Refill: Capillary refill takes less than 2 seconds.  Neurological:     General: No focal deficit present.     Mental Status: She is alert.  Psychiatric:        Mood and Affect: Mood normal.     ED Results / Procedures / Treatments   Labs (all labs ordered are listed, but only abnormal results are displayed) Labs Reviewed  BASIC METABOLIC PANEL - Abnormal; Notable for the following components:      Result Value   Glucose, Bld 101 (*)    Calcium 8.8 (*)    All other components within normal limits  CBC WITH DIFFERENTIAL/PLATELET - Abnormal; Notable for the following components:   RBC 5.13 (*)    Hemoglobin 15.3 (*)    HCT 46.2 (*)    All other components within normal limits  HCG, SERUM, QUALITATIVE  TROPONIN I (HIGH SENSITIVITY)    EKG EKG Interpretation Date/Time:  Saturday February 05 2023 12:31:48 EST Ventricular Rate:  82 PR Interval:  132 QRS Duration:  84 QT Interval:  378 QTC Calculation: 441 R Axis:   68  Text Interpretation: Normal sinus rhythm Normal ECG When compared with ECG of 23-Oct-2021 12:18, PREVIOUS ECG IS PRESENT Confirmed by Mannie Pac [phone]) on 02/05/2023 3:54:02 PM  Radiology DG Chest 1 View Result Date: 02/05/2023 CLINICAL DATA:  [id number] Chest pain [id number] EXAM: CHEST  1 VIEW COMPARISON:  None Available. FINDINGS: Lungs are clear.  No pneumothorax. Heart size and mediastinal contours are within normal limits. No effusion. Visualized bones unremarkable. IMPRESSION: No acute cardiopulmonary disease. Electronically Signed   By: JONETTA Faes M.D.   On: 02/05/2023 14:34    Procedures Procedures    Medications Ordered in ED Medications - No data to display  ED Course/ Medical Decision Making/ A&P                                 Medical Decision Making  This patient presents to the ED with chief complaint(s)  of intermittent chest pain with pertinent past medical history of none which further complicates the presenting complaint. The complaint involves an extensive differential diagnosis and also carries with it a high risk of complications and morbidity.    The differential diagnosis includes STEMI, NSTEMI, anxiety, precordial catch syndrome  Additional history obtained: Records reviewed Primary Care Documents  ED Course and Reassessment:   Independent labs interpretation:  The following labs were independently interpreted:  CBC: Mildly elevated hemoglobin BMP: Mildly decreased calcium hCG: Negative EKG: Normal sinus rhythm Troponin: Less than 2  Independent visualization of imaging: - I independently visualized the following imaging with scope of interpretation limited to determining acute life threatening conditions related to emergency care: Chest x-ray, which revealed no acute cardiopulmonary disease  Consultation: - Consulted or discussed management/test interpretation w/ external professional: None  Consideration for admission or further workup: Consider for mission further workup however patient's vital signs, physical exam, labs, and imaging of open reassuring.  Patient will be referred to cardiology for possible Holter monitor given the episodes are intermittent.        Final Clinical Impression(s) / ED Diagnoses Final diagnoses:  Chest pain, unspecified type    Rx / DC Orders ED Discharge Orders          Ordered    Ambulatory referral to Cardiology       Comments: If you have not heard from the Cardiology office within the next 72 hours please call [phone].   02/05/23 1611              Francis Ileana SAILOR, PA-C 02/05/23 1612    Mannie Pac T, DO 02/06/23 2052

## 2023-02-18 ENCOUNTER — Encounter: Payer: Self-pay | Admitting: Cardiology

## 2023-02-18 ENCOUNTER — Other Ambulatory Visit (HOSPITAL_COMMUNITY): Payer: Self-pay

## 2023-02-18 ENCOUNTER — Ambulatory Visit (INDEPENDENT_AMBULATORY_CARE_PROVIDER_SITE_OTHER): Payer: 59 | Admitting: Cardiology

## 2023-02-18 VITALS — BP 134/78 | HR 88 | Ht 63.0 in | Wt 182.0 lb

## 2023-02-18 DIAGNOSIS — Z131 Encounter for screening for diabetes mellitus: Secondary | ICD-10-CM

## 2023-02-18 DIAGNOSIS — Z13220 Encounter for screening for lipoid disorders: Secondary | ICD-10-CM | POA: Diagnosis not present

## 2023-02-18 DIAGNOSIS — R079 Chest pain, unspecified: Secondary | ICD-10-CM

## 2023-02-18 MED ORDER — BLOOD PRESSURE MONITOR AUTOMAT DEVI
1.0000 [IU] | Freq: Once | 0 refills | Status: AC
  Filled 2023-02-18: qty 1, 1d supply, fill #0

## 2023-02-18 MED ORDER — BLOOD PRESSURE MONITOR AUTOMAT DEVI
1.0000 [IU] | Freq: Once | 0 refills | Status: DC
  Filled 2023-02-18: qty 1, 28d supply, fill #0

## 2023-02-18 NOTE — Progress Notes (Unsigned)
Cardio-Obstetrics Clinic  New Evaluation  Date:  02/20/2023   ID:  Julie Ellison, DOB January 01, 1996, MRN [id number]  PCP:  Patient, No Pcp Per   [facility] Providers Cardiologist:  Thomasene Ripple, DO  Electrophysiologist:  None       Referring MD: Dolphus Jenny, PA-C   Chief Complaint: " I am ok"   History of Present Illness:    Julie Ellison is a 28 y.o. female [G3P2012] who is being seen today for the evaluation of chest pain at the request of Dolphus Jenny, PA-C.   Medical history includes severe preeclampsia during her first pregnancy.   She presents with chest pain that started three days after she went home from her second delivery. Initially, she attributed the pain to not taking it easy and dismissed it. However, the chest pain became more consistent during the first three months after her second delivery. The pain comes and goes, and she has tried to manage it by avoiding drinking cold water, which she initially thought might be a trigger. Despite this, the pain persists and had a severe episode about a week and a half ago that lasted approximately twenty minutes. She went to the emergency room, but the pain had subsided by the time she was seen. She also reports episodes of dizziness, during which she feels like she might lose her vision and starts sweating. These episodes make her feel sick and she suspects they might be related to her blood sugar levels.  Of note she reports that her father died of a myocardial infarction in his 76s.   Prior CV Studies Reviewed: The following studies were reviewed today: Echo   Past Medical History:  Diagnosis Date   Diabetes mellitus without complication (HCC)    Gestational diabetes    History of ectopic pregnancy 11/04/2021   Managed surgically, R salpingectomy   History of gestational diabetes in prior pregnancy, currently pregnant 11/03/2021   History of pre-eclampsia in prior pregnancy, currently  pregnant 09/24/2019   [x]  ASA [ ] Baseline P:C ratio    Latest Ref Rng & Units 07/27/2021   12:57 PM Glucose 70 - 99 mg/dL 98  BUN 6 - 20 mg/dL 6  Creatinine 1.32 - 4.40 mg/dL 1.02  Sodium 725 - 366 mmol/L 136  Potassium 3.5 - 5.1 mmol/L 3.4  Chloride 98 - 111 mmol/L 104  CO2 22 - 32 mmol/L 22  Calcium 8.9 - 10.3 mg/dL 8.6  Total Protein 6.5 - 8.1 g/dL 7.0  Total Bilirubin 0.3 - 1.2 mg/dL 0.4  Alkaline Phos 38 - 126 U/L    Hx of gonorrhea 2017   Hypertension     Past Surgical History:  Procedure Laterality Date   DIAGNOSTIC LAPAROSCOPY WITH REMOVAL OF ECTOPIC PREGNANCY N/A 11/03/2017   Procedure: DIAGNOSTIC LAPAROSCOPY, Right Salpingectomy with removal ECTOPIC PREGNANCY;  Surgeon: Willodean Rosenthal, MD;  Location: [facility];  Service: Gynecology;  Laterality: N/A;      OB History     Gravida  3   Para  2   Term  2   Preterm      AB  1   Living  2      SAB      IAB      Ectopic  1   Multiple  0   Live Births  2               Current Medications: Current Meds  Medication Sig   [DISCONTINUED] Blood Pressure  Monitoring (BLOOD PRESSURE MONITOR AUTOMAT) DEVI Use as directed.     Allergies:   Patient has no known allergies.   Social History   Socioeconomic History   Marital status: Married    Spouse name: Taurino   Number of children: Not on file   Years of education: Not on file   Highest education level: High school graduate  Occupational History   Not on file  Tobacco Use   Smoking status: Former   Smokeless tobacco: Never  Vaping Use   Vaping status: Some Days  Substance and Sexual Activity   Alcohol use: Not Currently   Drug use: Never   Sexual activity: Yes    Birth control/protection: None  Other Topics Concern   Not on file  Social History Narrative   Not on file   Social Drivers of Health   Financial Resource Strain: Not on file  Food Insecurity: Not on file  Transportation Needs: Not on file  Physical Activity: Not on file   Stress: Not on file  Social Connections: Not on file      Family History  Problem Relation Age of Onset   Hypertension Maternal Grandfather    Asthma Neg Hx    Diabetes Neg Hx    Heart disease Neg Hx    Stroke Neg Hx       ROS:   Please see the history of present illness.    Chest pain and palpitation s All other systems reviewed and are negative.   Labs/EKG Reviewed:    EKG:   none today, previous ecg reviewed   Recent Labs: 02/05/2023: BUN 8; Creatinine, Ser 0.53; Hemoglobin 15.3; Platelets 288; Potassium 3.9; Sodium 139   Recent Lipid Panel No results found for: "CHOL", "TRIG", "HDL", "CHOLHDL", "LDLCALC", "LDLDIRECT"  Physical Exam:    VS:  BP 134/78 (BP Location: Left Arm)   Pulse 88   Ht 5\' 3"  (1.6 m)   Wt 182 lb (82.6 kg)   SpO2 98%   BMI 32.24 kg/m     Wt Readings from Last 3 Encounters:  02/18/23 182 lb (82.6 kg)  02/05/23 180 lb (81.6 kg)  04/22/22 173 lb 12.8 oz (78.8 kg)     GEN:  Well nourished, well developed in no acute distress HEENT: Normal NECK: No JVD; No carotid bruits LYMPHATICS: No lymphadenopathy CARDIAC: RRR, no murmurs, rubs, gallops RESPIRATORY:  Clear to auscultation without rales, wheezing or rhonchi  ABDOMEN: Soft, non-tender, non-distended MUSCULOSKELETAL:  No edema; No deformity  SKIN: Warm and dry NEUROLOGIC:  Alert and oriented x 3 PSYCHIATRIC:  Normal affect    Risk Assessment/Risk Calculators:                 ASSESSMENT & PLAN:    Chest Pain Recurrent episodes of chest pain since second pregnancy delivery in Jan 2024. No clear precipitating factors identified. Family history of early cardiac death in father. -Order stress echocardiogram to evaluate for possible cardiac etiology of chest pain.  Elevated blood pressure  Family history of hypertension. -Advise patient to monitor blood pressure daily for two weeks and report readings via patient portal.  Gestational Diabetes History of gestational diabetes  with both pregnancies. Last HbA1c was 4.5 a year ago. Recent episodes of dizziness and vision changes suggestive of possible hypoglycemia. -Order HbA1c and lipid profile to assess for possible diabetes and dyslipidemia. -Advise patient to monitor for symptoms of hypoglycemia and to consume a small snack if symptoms occur.  General Health Maintenance -Invite patient  to attend [facility] event for cardiovascular disease prevention education.  Patient Instructions  Medication Instructions:  Your physician recommends that you continue on your current medications as directed. Please refer to the Current Medication list given to you today.  *If you need a refill on your cardiac medications before your next appointment, please call your pharmacy*   Lab Work: Lab is open from 8am-4pm  Lipid, HbgA1c - please come fasting.   If you have labs (blood work) drawn today and your tests are completely normal, you will receive your results only by: MyChart Message (if you have MyChart) OR A paper copy in the mail If you have any lab test that is abnormal or we need to change your treatment, we will call you to review the results.   Testing/Procedures: Your physician has requested that you have a Exercise Stress Echocardiogram. Please follow instruction sheet as given.      Stress Echocardiogram Information Sheet                                                      Instructions:    1. You may take your morning medications the morning of the test.  2. Light breakfast no caffeine.  3. Dress prepared to exercise.  4. DO NOT use ANY caffeine or tobacco products 3 hours before appointment.  5. Please bring all current prescription medications.   Follow-Up: At [facility], you and your health needs are our priority.  As part of our continuing mission to provide you with exceptional heart care, we have created designated Provider Care Teams.  These Care Teams include your  primary Cardiologist (physician) and Advanced Practice Providers (APPs -  Physician Assistants and Nurse Practitioners) who all work together to provide you with the care you need, when you need it.  Your next appointment:   16 week(s)  Provider:   Thomasene Ripple, DO [address], [place], [place] [address]  Other Instructions:  Please take your blood pressure daily for 2 weeks and send in a MyChart message. Please include heart rates. (One message at the end of the 2 weeks).   HOW TO TAKE YOUR BLOOD PRESSURE: Rest 5 minutes before taking your blood pressure. Don't smoke or drink caffeinated beverages for at least 30 minutes before. Take your blood pressure before (not after) you eat. Sit comfortably with your back supported and both feet on the floor (don't cross your legs). Elevate your arm to heart level on a table or a desk. Use the proper sized cuff. It should fit smoothly and snugly around your bare upper arm. There should be enough room to slip a fingertip under the cuff. The bottom edge of the cuff should be 1 inch above the crease of the elbow. Ideally, take 3 measurements at one sitting and record the average.       Dispo:  No follow-ups on file.   Medication Adjustments/Labs and Tests Ordered: Current medicines are reviewed at length with the patient today.  Concerns regarding medicines are outlined above.  Tests Ordered: Orders Placed This Encounter  Procedures   Lipid panel   Hemoglobin A1c   Cardiac Stress Test: Informed Consent Details: Physician/Practitioner Attestation; Transcribe to consent form and obtain patient signature   ECHOCARDIOGRAM STRESS TEST   Medication Changes: Meds ordered this encounter  Medications   DISCONTD: Blood Pressure Monitoring (BLOOD PRESSURE MONITOR AUTOMAT) DEVI    Sig: Use as directed.    Dispense:  1 each    Refill:  0   Blood Pressure Monitoring (BLOOD PRESSURE MONITOR AUTOMAT) DEVI    Sig: Use as directed.    Dispense:   1 each    Refill:  0

## 2023-02-18 NOTE — Patient Instructions (Addendum)
Medication Instructions:  Your physician recommends that you continue on your current medications as directed. Please refer to the Current Medication list given to you today.  *If you need a refill on your cardiac medications before your next appointment, please call your pharmacy*   Lab Work: Lab is open from 8am-4pm  Lipid, HbgA1c - please come fasting.   If you have labs (blood work) drawn today and your tests are completely normal, you will receive your results only by: MyChart Message (if you have MyChart) OR A paper copy in the mail If you have any lab test that is abnormal or we need to change your treatment, we will call you to review the results.   Testing/Procedures: Your physician has requested that you have a Exercise Stress Echocardiogram. Please follow instruction sheet as given.      Stress Echocardiogram Information Sheet                                                      Instructions:    1. You may take your morning medications the morning of the test.  2. Light breakfast no caffeine.  3. Dress prepared to exercise.  4. DO NOT use ANY caffeine or tobacco products 3 hours before appointment.  5. Please bring all current prescription medications.   Follow-Up: At [facility], you and your health needs are our priority.  As part of our continuing mission to provide you with exceptional heart care, we have created designated Provider Care Teams.  These Care Teams include your primary Cardiologist (physician) and Advanced Practice Providers (APPs -  Physician Assistants and Nurse Practitioners) who all work together to provide you with the care you need, when you need it.  Your next appointment:   16 week(s)  Provider:   Thomasene Ripple, DO [address], [place], [place] [address]  Other Instructions:  Please take your blood pressure daily for 2 weeks and send in a MyChart message. Please include heart rates. (One message at the end of the 2  weeks).   HOW TO TAKE YOUR BLOOD PRESSURE: Rest 5 minutes before taking your blood pressure. Don't smoke or drink caffeinated beverages for at least 30 minutes before. Take your blood pressure before (not after) you eat. Sit comfortably with your back supported and both feet on the floor (don't cross your legs). Elevate your arm to heart level on a table or a desk. Use the proper sized cuff. It should fit smoothly and snugly around your bare upper arm. There should be enough room to slip a fingertip under the cuff. The bottom edge of the cuff should be 1 inch above the crease of the elbow. Ideally, take 3 measurements at one sitting and record the average.

## 2023-02-19 ENCOUNTER — Other Ambulatory Visit (HOSPITAL_COMMUNITY): Payer: Self-pay

## 2023-02-21 ENCOUNTER — Other Ambulatory Visit (HOSPITAL_COMMUNITY): Payer: Self-pay

## 2023-02-25 DIAGNOSIS — Z13220 Encounter for screening for lipoid disorders: Secondary | ICD-10-CM | POA: Diagnosis not present

## 2023-02-25 DIAGNOSIS — Z131 Encounter for screening for diabetes mellitus: Secondary | ICD-10-CM | POA: Diagnosis not present

## 2023-02-28 ENCOUNTER — Encounter (HOSPITAL_COMMUNITY): Payer: Self-pay

## 2023-03-08 ENCOUNTER — Encounter: Payer: Self-pay | Admitting: Cardiology

## 2023-03-08 ENCOUNTER — Ambulatory Visit (HOSPITAL_COMMUNITY): Payer: 59 | Attending: Cardiovascular Disease

## 2023-03-08 ENCOUNTER — Ambulatory Visit (HOSPITAL_COMMUNITY): Payer: 59 | Attending: Internal Medicine

## 2023-03-08 DIAGNOSIS — R079 Chest pain, unspecified: Secondary | ICD-10-CM | POA: Diagnosis present

## 2023-03-08 LAB — ECHOCARDIOGRAM STRESS TEST
Area-P 1/2: 3.31 cm2
S' Lateral: 3.2 cm

## 2023-03-08 MED ORDER — PERFLUTREN LIPID MICROSPHERE
1.0000 mL | INTRAVENOUS | Status: AC | PRN
  Administered 2023-03-08: 6 mL via INTRAVENOUS
